# Patient Record
Sex: Male | Born: 1970 | Race: White | Hispanic: No | Marital: Married | Smoking: Never smoker
Health system: Southern US, Community
[De-identification: ages and names within clinical notes are randomized; demographics above are authoritative.]

## PROBLEM LIST (undated history)

## (undated) DIAGNOSIS — F419 Anxiety disorder, unspecified: Secondary | ICD-10-CM

## (undated) DIAGNOSIS — F32A Depression, unspecified: Secondary | ICD-10-CM

## (undated) HISTORY — DX: Anxiety disorder, unspecified: F41.9

## (undated) HISTORY — PX: NO PAST SURGERIES: SHX2092

## (undated) HISTORY — DX: Depression, unspecified: F32.A

---

## 2002-07-25 DIAGNOSIS — F32A Depression, unspecified: Secondary | ICD-10-CM | POA: Insufficient documentation

## 2002-07-25 DIAGNOSIS — G4733 Obstructive sleep apnea (adult) (pediatric): Secondary | ICD-10-CM | POA: Insufficient documentation

## 2005-12-07 ENCOUNTER — Ambulatory Visit: Payer: Self-pay | Admitting: Otolaryngology

## 2007-05-20 DIAGNOSIS — E669 Obesity, unspecified: Secondary | ICD-10-CM | POA: Insufficient documentation

## 2007-06-12 DIAGNOSIS — E881 Lipodystrophy, not elsewhere classified: Secondary | ICD-10-CM | POA: Insufficient documentation

## 2007-11-15 ENCOUNTER — Ambulatory Visit: Payer: Self-pay | Admitting: Family Medicine

## 2013-11-25 ENCOUNTER — Ambulatory Visit: Payer: Self-pay | Admitting: Family Medicine

## 2013-12-06 ENCOUNTER — Ambulatory Visit: Payer: Self-pay | Admitting: Family Medicine

## 2015-11-16 IMAGING — NM NUCLEAR MEDICINE WHOLE BODY BONE SCINTIGRAPHY
2 series · 10 of 10 positions shown · non-contrast
Comparison: Radiographs November 25, 2013.

CLINICAL DATA: L2 sclerotic lesion.  Right elbow pain.

EXAM:
NUCLEAR MEDICINE WHOLE BODY BONE SCAN
TECHNIQUE: Whole body anterior and posterior images were obtained approximately
3 hours after intravenous injection of radiopharmaceutical.
RADIOPHARMACEUTICALS:  23.53 mCi Lechnetium-NN MDP

[Series 1000: 3 hr wholebody · 2.40mm/px · 2 of 2 frames shown]
[frame 1/2]
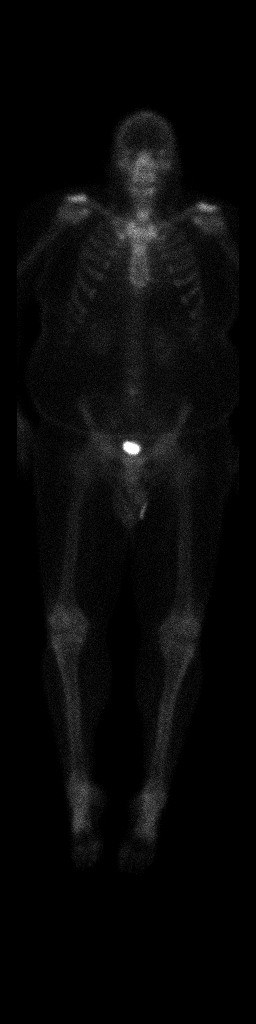
[frame 2/2]
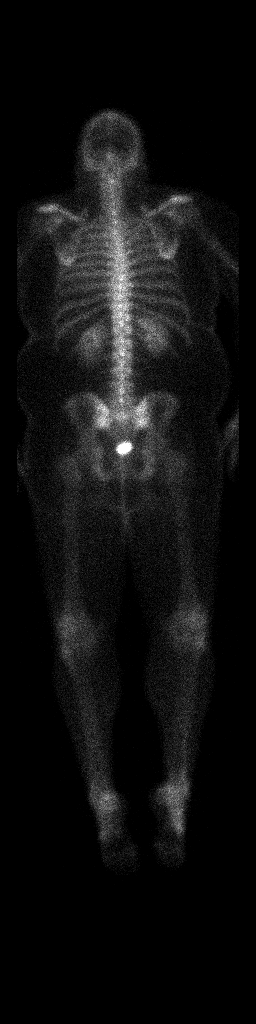

[Series 1000: statics · 2.40mm/px · 4 acquisitions, 8 frames shown]
[im 1/4]
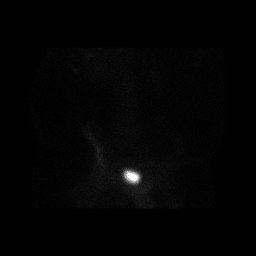
[im 1/4]
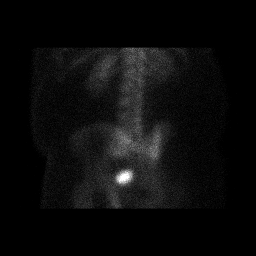
[im 2/4]
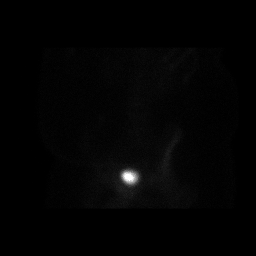
[im 2/4]
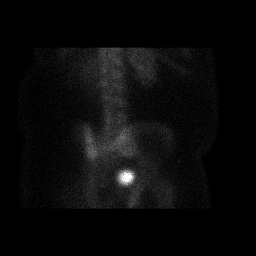
[im 3/4  full-range]
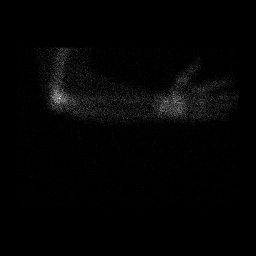
[im 3/4]
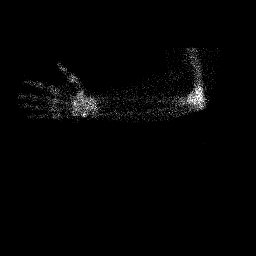
[im 4/4  full-range]
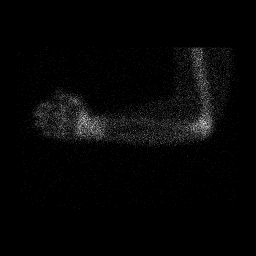
[im 4/4  full-range]
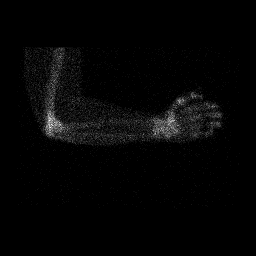

[10 of 10 positions shown; findings below may reference images not displayed]

FINDINGS: No abnormal uptake is seen in the skeleton.
IMPRESSION: No abnormal osseous uptake seen.

## 2022-04-16 DIAGNOSIS — R519 Headache, unspecified: Secondary | ICD-10-CM | POA: Insufficient documentation

## 2022-04-16 NOTE — Patient Instructions (Signed)

## 2022-04-18 ENCOUNTER — Encounter: Payer: Self-pay | Admitting: Nurse Practitioner

## 2022-04-18 ENCOUNTER — Ambulatory Visit (INDEPENDENT_AMBULATORY_CARE_PROVIDER_SITE_OTHER): Payer: 59 | Admitting: Nurse Practitioner

## 2022-04-18 VITALS — BP 123/86 | HR 91 | Temp 98.1°F | Ht 69.5 in | Wt 310.1 lb

## 2022-04-18 DIAGNOSIS — Z7689 Persons encountering health services in other specified circumstances: Secondary | ICD-10-CM

## 2022-04-18 DIAGNOSIS — F321 Major depressive disorder, single episode, moderate: Secondary | ICD-10-CM

## 2022-04-18 DIAGNOSIS — Z136 Encounter for screening for cardiovascular disorders: Secondary | ICD-10-CM | POA: Diagnosis not present

## 2022-04-18 DIAGNOSIS — R69 Illness, unspecified: Secondary | ICD-10-CM | POA: Diagnosis not present

## 2022-04-18 DIAGNOSIS — N400 Enlarged prostate without lower urinary tract symptoms: Secondary | ICD-10-CM | POA: Diagnosis not present

## 2022-04-18 DIAGNOSIS — E669 Obesity, unspecified: Secondary | ICD-10-CM | POA: Insufficient documentation

## 2022-04-18 DIAGNOSIS — Z6841 Body Mass Index (BMI) 40.0 and over, adult: Secondary | ICD-10-CM

## 2022-04-18 DIAGNOSIS — Z13220 Encounter for screening for lipoid disorders: Secondary | ICD-10-CM | POA: Diagnosis not present

## 2022-04-18 DIAGNOSIS — Z833 Family history of diabetes mellitus: Secondary | ICD-10-CM | POA: Diagnosis not present

## 2022-04-18 DIAGNOSIS — G4733 Obstructive sleep apnea (adult) (pediatric): Secondary | ICD-10-CM

## 2022-04-18 MED ORDER — SERTRALINE HCL 100 MG PO TABS: 200.0000 mg | ORAL_TABLET | Freq: Every day | ORAL | 4 refills | Status: DC

## 2022-04-18 NOTE — Assessment & Plan Note (Signed)
BMI 45.14.  Recommended eating smaller high protein, low fat meals more frequently and exercising 30 mins a day 5 times a week with a goal of 10-15lb weight loss in the next 3 months. Patient voiced their understanding and motivation to adhere to these recommendations.  Labs today to include CBC, CMP, TSH, lipid, A1c.

## 2022-04-18 NOTE — Assessment & Plan Note (Signed)
Chronic, ongoing for many years.  Denies SI/HI.  At this time wishes to maintain current medication regimen, but discussed with him adding on Wellbutrin in future for mood -- which has less sex drive and weight gain side effects, but may increase motivation and mood.  Refills sent in on Sertraline 200 MG QHS.

## 2022-04-18 NOTE — Assessment & Plan Note (Signed)
Ongoing with current non-use of CPAP due to issues with mask.  Highly recommend new sleep study and use of CPAP.  Educated on risks of non-use over time to health.  He will check with insurance and alert provider if coverage for new study. Will order referral to [facility] if so.

## 2022-04-18 NOTE — Progress Notes (Signed)
New Patient Office Visit  Subjective    Patient ID: Roger Ray, male    DOB: 08/13/1970  Age: 51 y.o. MRN: [id number]  CC:  Chief Complaint  Patient presents with   New Patient (Initial Visit)    Patient is here for New Patient to Establish Care. Patient denies having any concerns at today's visit.     HPI Roger Ray presents for new patient visit to establish care.  Introduced to Publishing rights manager role and practice setting.  All questions answered.  Discussed provider/patient relationship and expectations.  Has not had a physical or labs in a long while.    SLEEP APNEA Diagnosed several years ago, does not use CPAP as mask is difficult to use and uncomfortable. Sleep apnea status: uncontrolled Duration: chronic Satisfied with current treatment?:  no CPAP use:  no Sleep quality with CPAP use: poor Treament compliance:poor compliance Last sleep study: years ago Treatments attempted: CPAP Wakes feeling refreshed:  no Daytime hypersomnolence:  yes Fatigue:  yes Insomnia:  yes Good sleep hygiene:  yes Difficulty falling asleep:  yes Difficulty staying asleep:  yes Snoring bothers bed partner:  yes Observed apnea by bed partner: yes Obesity:  yes Hypertension: no  Pulmonary hypertension:  no Coronary artery disease:  no   DEPRESSION Has been on Sertraline for many years -- taking 200 MG at night.  He reports this overall helps with mood.  Mood status: stable Satisfied with current treatment?: yes Symptom severity: moderate  Duration of current treatment : chronic Side effects: no Medication compliance: good compliance Psychotherapy/counseling: none Previous psychiatric medications: Sertraline Depressed mood: yes Anxious mood: yes Anhedonia: no Significant weight loss or gain: no Insomnia: yes hard to fall asleep Fatigue: yes Feelings of worthlessness or guilt: no Impaired concentration/indecisiveness: no Suicidal ideations: no Hopelessness: no Crying  spells: no    04/18/2022    2:37 PM  Depression screen PHQ 2/9  Decreased Interest 1  Down, Depressed, Hopeless 2  PHQ - 2 Score 3  Altered sleeping 3  Tired, decreased energy 2  Change in appetite 1  Feeling bad or failure about yourself  2  Trouble concentrating 1  Moving slowly or fidgety/restless 0  Suicidal thoughts 0  PHQ-9 Score 12  Difficult doing work/chores Somewhat difficult       04/18/2022    2:37 PM  GAD 7 : Generalized Anxiety Score  Nervous, Anxious, on Edge 3  Control/stop worrying 2  Worry too much - different things 2  Trouble relaxing 1  Restless 1  Easily annoyed or irritable 2  Afraid - awful might happen 2  Total GAD 7 Score 13  Anxiety Difficulty Somewhat difficult     Outpatient Encounter Medications as of 04/18/2022  Medication Sig   MULTIPLE VITAMIN PO Take 1 tablet by mouth daily.   [DISCONTINUED] sertraline (ZOLOFT) 100 MG tablet Take 100 mg by mouth daily.   sertraline (ZOLOFT) 100 MG tablet Take 2 tablets (200 mg total) by mouth daily.   No facility-administered encounter medications on file as of 04/18/2022.    Past Medical History:  Diagnosis Date   Anxiety    Depression     Past Surgical History:  Procedure Laterality Date   NO PAST SURGERIES      Family History  Problem Relation Age of Onset   Diabetes Mother    Stroke Father    Healthy Brother    Autism spectrum disorder Son    Stroke Paternal Grandfather     Social  History   Socioeconomic History   Marital status: Married    Spouse name: Not on file   Number of children: 1   Years of education: Not on file   Highest education level: Not on file  Occupational History   Not on file  Tobacco Use   Smoking status: Never   Smokeless tobacco: Never  Vaping Use   Vaping Use: Never used  Substance and Sexual Activity   Alcohol use: Never   Drug use: Never   Sexual activity: Not Currently  Other Topics Concern   Not on file  Social History Narrative   Not on  file   Social Determinants of Health   Financial Resource Strain: Low Risk  (04/18/2022)   Overall Financial Resource Strain (CARDIA)    Difficulty of Paying Living Expenses: Not hard at all  Food Insecurity: No Food Insecurity (04/18/2022)   Hunger Vital Sign    Worried About Running Out of Food in the Last Year: Never true    Ran Out of Food in the Last Year: Never true  Transportation Needs: No Transportation Needs (04/18/2022)   PRAPARE - Administrator, Civil Service (Medical): No    Lack of Transportation (Non-Medical): No  Physical Activity: Inactive (04/18/2022)   Exercise Vital Sign    Days of Exercise per Week: 0 days    Minutes of Exercise per Session: 0 min  Stress: No Stress Concern Present (04/18/2022)   [employer] of Occupational Health - Occupational Stress Questionnaire    Feeling of Stress : Not at all  Social Connections: Moderately Integrated (04/18/2022)   Social Connection and Isolation Panel [NHANES]    Frequency of Communication with Friends and Family: More than three times a week    Frequency of Social Gatherings with Friends and Family: More than three times a week    Attends Religious Services: More than 4 times per year    Active Member of [employer] or Organizations: No    Attends Banker Meetings: Never    Marital Status: Married  Catering manager Violence: Not At Risk (04/18/2022)   Humiliation, Afraid, Rape, and Kick questionnaire    Fear of Current or Ex-Partner: No    Emotionally Abused: No    Physically Abused: No    Sexually Abused: No    Review of Systems  Constitutional:  Negative for chills, diaphoresis, fever and weight loss.  Respiratory:  Negative for cough, shortness of breath and wheezing.   Cardiovascular:  Negative for chest pain, palpitations, orthopnea and leg swelling.  Gastrointestinal: Negative.   Neurological: Negative.   Endo/Heme/Allergies: Negative.   Psychiatric/Behavioral:  Positive for  depression. Negative for suicidal ideas. The patient is nervous/anxious and has insomnia.       Objective    BP 123/86   Pulse 91   Temp 98.1 F (36.7 C) (Oral)   Ht 5' 9.5" (1.765 m)   Wt (!) 310 lb 1.6 oz (140.7 kg)   SpO2 94%   BMI 45.14 kg/m   Physical Exam Vitals and nursing note reviewed.  Constitutional:      General: He is awake. He is not in acute distress.    Appearance: He is well-developed and well-groomed. He is obese. He is not ill-appearing or toxic-appearing.  HENT:     Head: Normocephalic and atraumatic.     Right Ear: Hearing normal. No drainage.     Left Ear: Hearing normal. No drainage.  Eyes:     General: Lids  are normal.        Right eye: No discharge.        Left eye: No discharge.     Conjunctiva/sclera: Conjunctivae normal.     Pupils: Pupils are equal, round, and reactive to light.  Neck:     Thyroid: No thyromegaly.     Vascular: No carotid bruit.  Cardiovascular:     Rate and Rhythm: Normal rate and regular rhythm.     Heart sounds: Normal heart sounds, S1 normal and S2 normal. No murmur heard.    No gallop.  Pulmonary:     Effort: Pulmonary effort is normal. No accessory muscle usage or respiratory distress.     Breath sounds: Normal breath sounds.  Abdominal:     General: Bowel sounds are normal.     Palpations: Abdomen is soft. There is no hepatomegaly or splenomegaly.  Musculoskeletal:        General: Normal range of motion.     Cervical back: Normal range of motion and neck supple.     Right lower leg: No edema.     Left lower leg: No edema.  Lymphadenopathy:     Cervical: No cervical adenopathy.  Skin:    General: Skin is warm and dry.     Capillary Refill: Capillary refill takes less than 2 seconds.  Neurological:     Mental Status: He is alert and oriented to person, place, and time.     Deep Tendon Reflexes: Reflexes are normal and symmetric.  Psychiatric:        Attention and Perception: Attention normal.        Mood and  Affect: Mood normal.        Speech: Speech normal.        Behavior: Behavior normal. Behavior is cooperative.        Thought Content: Thought content normal.       Assessment & Plan:   Problem List Items Addressed This Visit       Respiratory   Obstructive apnea    Ongoing with current non-use of CPAP due to issues with mask.  Highly recommend new sleep study and use of CPAP.  Educated on risks of non-use over time to health.  He will check with insurance and alert provider if coverage for new study. Will order referral to [facility] if so.      Relevant Orders   CBC with Differential/Platelet   TSH     Other   Clinical depression - Primary    Chronic, ongoing for many years.  Denies SI/HI.  At this time wishes to maintain current medication regimen, but discussed with him adding on Wellbutrin in future for mood -- which has less sex drive and weight gain side effects, but may increase motivation and mood.  Refills sent in on Sertraline 200 MG QHS.      Relevant Medications   sertraline (ZOLOFT) 100 MG tablet   Other Relevant Orders   TSH   Family history of diabetes mellitus    In mother.  Will check A1c on patient today.      Obesity    BMI 45.14.  Recommended eating smaller high protein, low fat meals more frequently and exercising 30 mins a day 5 times a week with a goal of 10-15lb weight loss in the next 3 months. Patient voiced their understanding and motivation to adhere to these recommendations.  Labs today to include CBC, CMP, TSH, lipid, A1c.       Other  Visit Diagnoses     Benign prostatic hyperplasia without lower urinary tract symptoms       PSA check on labs today, discussed with patient.   Relevant Orders   PSA   Encounter for lipid screening for cardiovascular disease       Lipid panel on labs today.   Relevant Orders   Comprehensive metabolic panel   Lipid Panel w/o Chol/HDL Ratio   Encounter to establish care       New patient to clinic, introduced to  clinic setting and provider.       Return in about 6 months (around 10/17/2022) for DEPRESSION AND OSA.   Marjie Skiff, NP

## 2022-04-18 NOTE — Assessment & Plan Note (Signed)
In mother.  Will check A1c on patient today.

## 2022-04-19 LAB — COMPREHENSIVE METABOLIC PANEL
ALT: 19 IU/L (ref 0–44)
AST: 21 IU/L (ref 0–40)
Albumin/Globulin Ratio: 1.8 (ref 1.2–2.2)
Albumin: 5.1 g/dL (ref 4.1–5.1)
Alkaline Phosphatase: 73 IU/L (ref 44–121)
BUN/Creatinine Ratio: 15 (ref 9–20)
BUN: 12 mg/dL (ref 6–24)
Bilirubin Total: 0.4 mg/dL (ref 0.0–1.2)
CO2: 23 mmol/L (ref 20–29)
Calcium: 9.9 mg/dL (ref 8.7–10.2)
Chloride: 100 mmol/L (ref 96–106)
Creatinine, Ser: 0.81 mg/dL (ref 0.76–1.27)
Globulin, Total: 2.9 g/dL (ref 1.5–4.5)
Glucose: 82 mg/dL (ref 70–99)
Potassium: 4.2 mmol/L (ref 3.5–5.2)
Sodium: 140 mmol/L (ref 134–144)
Total Protein: 8 g/dL (ref 6.0–8.5)
eGFR: 107 mL/min/{1.73_m2} (ref >59)

## 2022-04-19 LAB — CBC WITH DIFFERENTIAL/PLATELET
Basophils Absolute: 0.1 10*3/uL (ref 0.0–0.2)
Basos: 1 %
EOS (ABSOLUTE): 0.2 10*3/uL (ref 0.0–0.4)
Eos: 3 %
Hematocrit: 43.7 % (ref 37.5–51.0)
Hemoglobin: 14.6 g/dL (ref 13.0–17.7)
Immature Grans (Abs): 0 10*3/uL (ref 0.0–0.1)
Immature Granulocytes: 0 %
Lymphocytes Absolute: 1.6 10*3/uL (ref 0.7–3.1)
Lymphs: 24 %
MCH: 29.9 pg (ref 26.6–33.0)
MCHC: 33.4 g/dL (ref 31.5–35.7)
MCV: 90 fL (ref 79–97)
Monocytes Absolute: 0.6 10*3/uL (ref 0.1–0.9)
Monocytes: 9 %
Neutrophils Absolute: 4.4 10*3/uL (ref 1.4–7.0)
Neutrophils: 63 %
Platelets: 261 10*3/uL (ref 150–450)
RBC: 4.88 x10E6/uL (ref 4.14–5.80)
RDW: 12.7 % (ref 11.6–15.4)
WBC: 6.9 10*3/uL (ref 3.4–10.8)

## 2022-04-19 LAB — LIPID PANEL W/O CHOL/HDL RATIO
Cholesterol, Total: 238 mg/dL — ABNORMAL HIGH (ref 100–199)
HDL: 51 mg/dL (ref >39)
LDL Chol Calc (NIH): 145 mg/dL — ABNORMAL HIGH (ref 0–99)
Triglycerides: 230 mg/dL — ABNORMAL HIGH (ref 0–149)
VLDL Cholesterol Cal: 42 mg/dL — ABNORMAL HIGH (ref 5–40)

## 2022-04-19 LAB — TSH: TSH: 1.73 u[IU]/mL (ref 0.450–4.500)

## 2022-04-19 LAB — PSA: Prostate Specific Ag, Serum: 1 ng/mL (ref 0.0–4.0)

## 2022-04-19 NOTE — Progress Notes (Signed)
Contacted via MyChart    Good morning Roger Ray, your labs have returned and are overall stable with exception of cholesterol levels.  Your cholesterol is high, but recommendations to make lifestyle changes. Your LDL is above normal. The LDL is the bad cholesterol. Over time and in combination with inflammation and other factors, this contributes to plaque which in turn may lead to stroke and/or heart attack down the road. Sometimes high LDL is primarily genetic, and people might be eating all the right foods but still have high numbers. Other times, there is room for improvement in one's diet and eating healthier can bring this number down and potentially reduce one's risk of heart attack and/or stroke.   To reduce your LDL, Remember - more fruits and vegetables, more fish, and limit red meat and dairy products. More soy, nuts, beans, barley, lentils, oats and plant sterol ester enriched margarine instead of butter. I also encourage eliminating sugar and processed food. Remember, shop on the outside of the grocery store and visit your [employer]. If you would like to talk with me about dietary changes for your cholesterol, please let me know. We should recheck your cholesterol in 6 months. Any questions on these? Keep being stellar!!  Thank you for allowing me to participate in your care.  I appreciate you. Kindest regards, Marjean Imperato

## 2022-08-16 ENCOUNTER — Ambulatory Visit: Payer: 59 | Admitting: Nurse Practitioner

## 2022-10-15 DIAGNOSIS — E7800 Pure hypercholesterolemia, unspecified: Secondary | ICD-10-CM | POA: Insufficient documentation

## 2022-10-15 NOTE — Patient Instructions (Incomplete)
Be Involved in [place]:  Taking Medications When medications are taken as directed, they can greatly improve your health. But if they are not taken as instructed, they may not work. In some cases, not taking them correctly can be harmful. To help ensure your treatment remains effective and safe, understand your medications and how to take them.  Your lab results, notes and after visit summary will be available on My Chart. We strongly encourage you to use this feature. If lab results are abnormal the clinic will contact you with the appropriate steps. If the clinic does not contact you assume the results are satisfactory. You can always see your results on My Chart. If you have questions regarding your condition, please contact the clinic during office hours. You can also ask questions on My Chart.  We at [facility] are grateful that you chose [place] to provide care. We strive to provide excellent and compassionate care and are always looking for feedback. If you get a survey from the clinic please complete this.   Check blood pressure 3 days a week and write them down.     DASH Eating Plan DASH stands for Dietary Approaches to Stop Hypertension. The DASH eating plan is a healthy eating plan that has been shown to: Reduce high blood pressure (hypertension). Reduce your risk for type 2 diabetes, heart disease, and stroke. Help with weight loss. What are tips for following this plan? Reading food labels Check food labels for the amount of salt (sodium) per serving. Choose foods with less than 5 percent of the Daily Value of sodium. Generally, foods with less than 300 milligrams (mg) of sodium per serving fit into this eating plan. To find whole grains, look for the word "whole" as the first word in the ingredient list. Shopping Buy products labeled as "low-sodium" or "no salt added." Buy fresh foods. Avoid canned foods and pre-made or frozen meals. Cooking Avoid adding salt  when cooking. Use salt-free seasonings or herbs instead of table salt or sea salt. Check with your health care provider or pharmacist before using salt substitutes. Do not fry foods. Cook foods using healthy methods such as baking, boiling, grilling, roasting, and broiling instead. Cook with heart-healthy oils, such as olive, canola, avocado, soybean, or sunflower oil. Meal planning  Eat a balanced diet that includes: 4 or more servings of fruits and 4 or more servings of vegetables each day. Try to fill one-half of your plate with fruits and vegetables. 6-8 servings of whole grains each day. Less than 6 oz (170 g) of lean meat, poultry, or fish each day. A 3-oz (85-g) serving of meat is about the same size as a deck of cards. One egg equals 1 oz (28 g). 2-3 servings of low-fat dairy each day. One serving is 1 cup (237 mL). 1 serving of nuts, seeds, or beans 5 times each week. 2-3 servings of heart-healthy fats. Healthy fats called omega-3 fatty acids are found in foods such as walnuts, flaxseeds, fortified milks, and eggs. These fats are also found in cold-water fish, such as sardines, salmon, and mackerel. Limit how much you eat of: Canned or prepackaged foods. Food that is high in trans fat, such as some fried foods. Food that is high in saturated fat, such as fatty meat. Desserts and other sweets, sugary drinks, and other foods with added sugar. Full-fat dairy products. Do not salt foods before eating. Do not eat more than 4 egg yolks a week. Try to  eat at least 2 vegetarian meals a week. Eat more home-cooked food and less restaurant, buffet, and fast food. Lifestyle When eating at a restaurant, ask that your food be prepared with less salt or no salt, if possible. If you drink alcohol: Limit how much you use to: 0-1 drink a day for women who are not pregnant. 0-2 drinks a day for men. Be aware of how much alcohol is in your drink. In the [place] one drink equals one 12 oz bottle of  beer (355 mL), one 5 oz glass of wine (148 mL), or one 1 oz glass of hard liquor (44 mL). General information Avoid eating more than 2,300 mg of salt a day. If you have hypertension, you may need to reduce your sodium intake to 1,500 mg a day. Work with your health care provider to maintain a healthy body weight or to lose weight. Ask what an ideal weight is for you. Get at least 30 minutes of exercise that causes your heart to beat faster (aerobic exercise) most days of the week. Activities may include walking, swimming, or biking. Work with your health care provider or dietitian to adjust your eating plan to your individual calorie needs. What foods should I eat? Fruits All fresh, dried, or frozen fruit. Canned fruit in natural juice (without added sugar). Vegetables Fresh or frozen vegetables (raw, steamed, roasted, or grilled). Low-sodium or reduced-sodium tomato and vegetable juice. Low-sodium or reduced-sodium tomato sauce and tomato paste. Low-sodium or reduced-sodium canned vegetables. Grains Whole-grain or whole-wheat bread. Whole-grain or whole-wheat pasta. Brown rice. [place]. Bulgur. Whole-grain and low-sodium cereals. Pita bread. Low-fat, low-sodium crackers. Whole-wheat flour tortillas. Meats and other proteins Skinless chicken or [place]. Ground chicken or [place]. Pork with fat trimmed off. Fish and seafood. Egg whites. Dried beans, peas, or lentils. Unsalted nuts, nut butters, and seeds. Unsalted canned beans. Lean cuts of beef with fat trimmed off. Low-sodium, lean precooked or cured meat, such as sausages or meat loaves. Dairy Low-fat (1%) or fat-free (skim) milk. Reduced-fat, low-fat, or fat-free cheeses. Nonfat, low-sodium ricotta or cottage cheese. Low-fat or nonfat yogurt. Low-fat, low-sodium cheese. Fats and oils Soft margarine without trans fats. Vegetable oil. Reduced-fat, low-fat, or light mayonnaise and salad dressings (reduced-sodium). Canola, safflower, olive,  avocado, soybean, and sunflower oils. Avocado. Seasonings and condiments Herbs. Spices. Seasoning mixes without salt. Other foods Unsalted popcorn and pretzels. Fat-free sweets. The items listed above may not be a complete list of foods and beverages you can eat. Contact a dietitian for more information. What foods should I avoid? Fruits Canned fruit in a light or heavy syrup. Fried fruit. Fruit in cream or butter sauce. Vegetables Creamed or fried vegetables. Vegetables in a cheese sauce. Regular canned vegetables (not low-sodium or reduced-sodium). Regular canned tomato sauce and paste (not low-sodium or reduced-sodium). Regular tomato and vegetable juice (not low-sodium or reduced-sodium). Angie Fava. Olives. Grains Baked goods made with fat, such as croissants, muffins, or some breads. Dry pasta or rice meal packs. Meats and other proteins Fatty cuts of meat. Ribs. Fried meat. Berniece Salines. Bologna, salami, and other precooked or cured meats, such as sausages or meat loaves. Fat from the back of a pig (fatback). Bratwurst. Salted nuts and seeds. Canned beans with added salt. Canned or smoked fish. Whole eggs or egg yolks. Chicken or [place] with skin. Dairy Whole or 2% milk, cream, and half-and-half. Whole or full-fat cream cheese. Whole-fat or sweetened yogurt. Full-fat cheese. Nondairy creamers. Whipped toppings. Processed cheese and cheese spreads. Fats and  oils Butter. Stick margarine. Lard. Shortening. Ghee. Bacon fat. Tropical oils, such as coconut, palm kernel, or palm oil. Seasonings and condiments Onion salt, garlic salt, seasoned salt, table salt, and sea salt. Worcestershire sauce. Tartar sauce. Barbecue sauce. Teriyaki sauce. Soy sauce, including reduced-sodium. Steak sauce. Canned and packaged gravies. Fish sauce. Oyster sauce. Cocktail sauce. Store-bought horseradish. Ketchup. Mustard. Meat flavorings and tenderizers. Bouillon cubes. Hot sauces. Pre-made or packaged marinades. Pre-made or  packaged taco seasonings. Relishes. Regular salad dressings. Other foods Salted popcorn and pretzels. The items listed above may not be a complete list of foods and beverages you should avoid. Contact a dietitian for more information. Where to find more information National Heart, Lung, and Blood Institute: https://wilson-eaton.com/ American Heart Association: www.heart.org Academy of Nutrition and Dietetics: www.eatright.[place]: www.kidney.org Summary The DASH eating plan is a healthy eating plan that has been shown to reduce high blood pressure (hypertension). It may also reduce your risk for type 2 diabetes, heart disease, and stroke. When on the DASH eating plan, aim to eat more fresh fruits and vegetables, whole grains, lean proteins, low-fat dairy, and heart-healthy fats. With the DASH eating plan, you should limit salt (sodium) intake to 2,300 mg a day. If you have hypertension, you may need to reduce your sodium intake to 1,500 mg a day. Work with your health care provider or dietitian to adjust your eating plan to your individual calorie needs. This information is not intended to replace advice given to you by your health care provider. Make sure you discuss any questions you have with your health care provider. Document Revised: 06/14/2019 Document Reviewed: 06/14/2019 Elsevier Patient Education  [place].

## 2022-10-17 ENCOUNTER — Encounter: Payer: Self-pay | Admitting: Nurse Practitioner

## 2022-10-17 ENCOUNTER — Ambulatory Visit (INDEPENDENT_AMBULATORY_CARE_PROVIDER_SITE_OTHER): Payer: 59 | Admitting: Nurse Practitioner

## 2022-10-17 VITALS — BP 142/90 | HR 99 | Temp 97.9°F | Resp 18 | Ht 69.49 in | Wt 309.5 lb

## 2022-10-17 DIAGNOSIS — Z6841 Body Mass Index (BMI) 40.0 and over, adult: Secondary | ICD-10-CM

## 2022-10-17 DIAGNOSIS — G4733 Obstructive sleep apnea (adult) (pediatric): Secondary | ICD-10-CM | POA: Diagnosis not present

## 2022-10-17 DIAGNOSIS — R030 Elevated blood-pressure reading, without diagnosis of hypertension: Secondary | ICD-10-CM | POA: Diagnosis not present

## 2022-10-17 DIAGNOSIS — E7800 Pure hypercholesterolemia, unspecified: Secondary | ICD-10-CM

## 2022-10-17 DIAGNOSIS — I10 Essential (primary) hypertension: Secondary | ICD-10-CM | POA: Insufficient documentation

## 2022-10-17 DIAGNOSIS — F3341 Major depressive disorder, recurrent, in partial remission: Secondary | ICD-10-CM | POA: Diagnosis not present

## 2022-10-17 NOTE — Assessment & Plan Note (Signed)
Noted on past labs, has not ate today.  Check lipid panel and continue diet focus.

## 2022-10-17 NOTE — Assessment & Plan Note (Addendum)
BMI 45.07.  Recommended eating smaller high protein, low fat meals more frequently and exercising 30 mins a day 5 times a week with a goal of 10-15lb weight loss in the next 3 months. Patient voiced their understanding and motivation to adhere to these recommendations.

## 2022-10-17 NOTE — Assessment & Plan Note (Signed)
Ongoing with current non-use of CPAP due to issues with mask.  Highly recommend new sleep study and use of CPAP.  Educated on risks of non-use over time to health, which we are seeing today with elevation in BP readings.  He will check with insurance and alert provider if coverage for new study. Will order referral to [facility] [facility] if so.

## 2022-10-17 NOTE — Assessment & Plan Note (Addendum)
Chronic, ongoing for many years.  Denies SI/HI.  At this time wishes to maintain current medication regimen, but discussed with him adding on Wellbutrin in future for mood -- which has less sex drive and weight gain side effects, but may increase motivation and mood.  Refills up to date.

## 2022-10-17 NOTE — Progress Notes (Addendum)
BP (!) 142/90 (BP Location: Left Arm, Patient Position: Sitting, Cuff Size: Large)   Pulse 99   Temp 97.9 F (36.6 C) (Oral)   Resp 18   Ht 5' 9.49" (1.765 m)   Wt (!) 309 lb 8 oz (140.4 kg)   SpO2 95%   BMI 45.07 kg/m    Subjective:    Patient ID: Roger Ray, male    DOB: [birth year], 52 y.o.   MRN: [id number]  HPI: Roger Ray is a 52 y.o. male  Chief Complaint  Patient presents with   Depression   Sleep Apnea   BP ELEVATION & OSA Elevated BP in office today. No alcohol use or smoking.  Does not use CPAP, not comfortable.  Used in past.  Has not used in awhile.  Last sleep study was years ago -- several.   Recurrent headaches: no Visual changes: no Palpitations: no Dyspnea: no Chest pain: no Lower extremity edema: no Dizzy/lightheaded: no   DEPRESSION Continues on Sertraline 200 MG daily. Mood status: stable Satisfied with current treatment?: yes Symptom severity: moderate  Duration of current treatment : chronic Side effects: no Medication compliance: good compliance Psychotherapy/counseling: yes in the past Depressed mood: no Anxious mood: no Anhedonia: no Significant weight loss or gain: no Insomnia: yes hard to stay asleep Fatigue: yes Feelings of worthlessness or guilt: no Impaired concentration/indecisiveness: no Suicidal ideations: no Hopelessness: no Crying spells: no    10/17/2022    3:38 PM 04/18/2022    2:37 PM  Depression screen PHQ 2/9  Decreased Interest 0 1  Down, Depressed, Hopeless 0 2  PHQ - 2 Score 0 3  Altered sleeping 2 3  Tired, decreased energy 1 2  Change in appetite 0 1  Feeling bad or failure about yourself  1 2  Trouble concentrating 0 1  Moving slowly or fidgety/restless 0 0  Suicidal thoughts  0  PHQ-9 Score 4 12  Difficult doing work/chores Somewhat difficult Somewhat difficult       10/17/2022    3:38 PM 04/18/2022    2:37 PM  GAD 7 : Generalized Anxiety Score  Nervous, Anxious, on Edge 0 3  Control/stop  worrying 1 2  Worry too much - different things 1 2  Trouble relaxing 1 1  Restless 0 1  Easily annoyed or irritable 1 2  Afraid - awful might happen 0 2  Total GAD 7 Score 4 13  Anxiety Difficulty Somewhat difficult Somewhat difficult   Relevant past medical, surgical, family and social history reviewed and updated as indicated. Interim medical history since our last visit reviewed. Allergies and medications reviewed and updated.  Review of Systems  Constitutional:  Negative for activity change, diaphoresis, fatigue and fever.  Respiratory:  Negative for cough, chest tightness, shortness of breath and wheezing.   Cardiovascular:  Negative for chest pain, palpitations and leg swelling.  Gastrointestinal: Negative.   Neurological: Negative.   Psychiatric/Behavioral: Negative.      Per HPI unless specifically indicated above     Objective:    BP (!) 142/90 (BP Location: Left Arm, Patient Position: Sitting, Cuff Size: Large)   Pulse 99   Temp 97.9 F (36.6 C) (Oral)   Resp 18   Ht 5' 9.49" (1.765 m)   Wt (!) 309 lb 8 oz (140.4 kg)   SpO2 95%   BMI 45.07 kg/m   Wt Readings from Last 3 Encounters:  10/17/22 (!) 309 lb 8 oz (140.4 kg)  04/18/22 (!) 310 lb 1.6  oz (140.7 kg)    Physical Exam Vitals and nursing note reviewed.  Constitutional:      General: He is awake. He is not in acute distress.    Appearance: He is well-developed and well-groomed. He is obese. He is not ill-appearing or toxic-appearing.  HENT:     Head: Normocephalic and atraumatic.     Right Ear: Hearing normal. No drainage.     Left Ear: Hearing normal. No drainage.  Eyes:     General: Lids are normal.        Right eye: No discharge.        Left eye: No discharge.     Conjunctiva/sclera: Conjunctivae normal.     Pupils: Pupils are equal, round, and reactive to light.  Neck:     Thyroid: No thyromegaly.     Vascular: No carotid bruit.  Cardiovascular:     Rate and Rhythm: Normal rate and regular  rhythm.     Heart sounds: Normal heart sounds, S1 normal and S2 normal. No murmur heard.    No gallop.  Pulmonary:     Effort: Pulmonary effort is normal. No accessory muscle usage or respiratory distress.     Breath sounds: Normal breath sounds.  Abdominal:     General: Bowel sounds are normal.     Palpations: Abdomen is soft. There is no hepatomegaly or splenomegaly.  Musculoskeletal:        General: Normal range of motion.     Cervical back: Normal range of motion and neck supple.     Right lower leg: No edema.     Left lower leg: No edema.  Lymphadenopathy:     Cervical: No cervical adenopathy.  Skin:    General: Skin is warm and dry.     Capillary Refill: Capillary refill takes less than 2 seconds.  Neurological:     Mental Status: He is alert and oriented to person, place, and time.     Deep Tendon Reflexes: Reflexes are normal and symmetric.  Psychiatric:        Attention and Perception: Attention normal.        Mood and Affect: Mood normal.        Speech: Speech normal.        Behavior: Behavior normal. Behavior is cooperative.        Thought Content: Thought content normal.    Results for orders placed or performed in visit on 04/18/22  CBC with Differential/Platelet  Result Value Ref Range   WBC 6.9 3.4 - 10.8 x10E3/uL   RBC 4.88 4.14 - 5.80 x10E6/uL   Hemoglobin 14.6 13.0 - 17.7 g/dL   Hematocrit 43.7 37.5 - 51.0 %   MCV 90 79 - 97 fL   MCH 29.9 26.6 - 33.0 pg   MCHC 33.4 31.5 - 35.7 g/dL   RDW 12.7 11.6 - 15.4 %   Platelets 261 150 - 450 x10E3/uL   Neutrophils 63 Not Estab. %   Lymphs 24 Not Estab. %   Monocytes 9 Not Estab. %   Eos 3 Not Estab. %   Basos 1 Not Estab. %   Neutrophils Absolute 4.4 1.4 - 7.0 x10E3/uL   Lymphocytes Absolute 1.6 0.7 - 3.1 x10E3/uL   Monocytes Absolute 0.6 0.1 - 0.9 x10E3/uL   EOS (ABSOLUTE) 0.2 0.0 - 0.4 x10E3/uL   Basophils Absolute 0.1 0.0 - 0.2 x10E3/uL   Immature Granulocytes 0 Not Estab. %   Immature Grans (Abs) 0.0  0.0 - 0.1 x10E3/uL  Comprehensive metabolic panel  Result Value Ref Range   Glucose 82 70 - 99 mg/dL   BUN 12 6 - 24 mg/dL   Creatinine, Ser 0.81 0.76 - 1.27 mg/dL   eGFR 107 >59 mL/min/1.73   BUN/Creatinine Ratio 15 9 - 20   Sodium 140 134 - 144 mmol/L   Potassium 4.2 3.5 - 5.2 mmol/L   Chloride 100 96 - 106 mmol/L   CO2 23 20 - 29 mmol/L   Calcium 9.9 8.7 - 10.2 mg/dL   Total Protein 8.0 6.0 - 8.5 g/dL   Albumin 5.1 4.1 - 5.1 g/dL   Globulin, Total 2.9 1.5 - 4.5 g/dL   Albumin/Globulin Ratio 1.8 1.2 - 2.2   Bilirubin Total 0.4 0.0 - 1.2 mg/dL   Alkaline Phosphatase 73 44 - 121 IU/L   AST 21 0 - 40 IU/L   ALT 19 0 - 44 IU/L  Lipid Panel w/o Chol/HDL Ratio  Result Value Ref Range   Cholesterol, Total 238 (H) 100 - 199 mg/dL   Triglycerides 230 (H) 0 - 149 mg/dL   HDL 51 >39 mg/dL   VLDL Cholesterol Cal 42 (H) 5 - 40 mg/dL   LDL Chol Calc (NIH) 145 (H) 0 - 99 mg/dL  PSA  Result Value Ref Range   Prostate Specific Ag, Serum 1.0 0.0 - 4.0 ng/mL  TSH  Result Value Ref Range   TSH 1.730 0.450 - 4.500 uIU/mL      Assessment & Plan:   Problem List Items Addressed This Visit       Respiratory   Obstructive apnea    Ongoing with current non-use of CPAP due to issues with mask.  Highly recommend new sleep study and use of CPAP.  Educated on risks of non-use over time to health, which we are seeing today with elevation in BP readings.  He will check with insurance and alert provider if coverage for new study. Will order referral to [facility] if so.        Other   Clinical depression - Primary    Chronic, ongoing for many years.  Denies SI/HI.  At this time wishes to maintain current medication regimen, but discussed with him adding on Wellbutrin in future for mood -- which has less sex drive and weight gain side effects, but may increase motivation and mood.  Refills up to date.      Elevated blood pressure reading without diagnosis of hypertension    Noted today in office on  initial and recheck. Discussed at length with him need to for lower BP and discussed that he would benefit from new sleep study, as consistent use of CPAP would benefit BP levels.  He will speak to spouse about this and see coverage.  At this time recommend check BP 3 days a week and document levels for provider.  Focus heavily on DASH diet and regular activity.  Return in 6 weeks with BP log.  If ongoing elevations he is aware we will need to initiate medication.  Labs today.      Relevant Orders   Comprehensive metabolic panel   Elevated low density lipoprotein (LDL) cholesterol level    Noted on past labs, has not ate today.  Check lipid panel and continue diet focus.      Relevant Orders   Lipid Panel w/o Chol/HDL Ratio   Obesity    BMI 45.07.  Recommended eating smaller high protein, low fat meals more frequently and exercising 30 mins a day  5 times a week with a goal of 10-15lb weight loss in the next 3 months. Patient voiced their understanding and motivation to adhere to these recommendations.          Follow up plan: Return in about 6 weeks (around 11/28/2022) for ELEVATED BP.

## 2022-10-17 NOTE — Assessment & Plan Note (Signed)
Noted today in office on initial and recheck. Discussed at length with him need to for lower BP and discussed that he would benefit from new sleep study, as consistent use of CPAP would benefit BP levels.  He will speak to spouse about this and see coverage.  At this time recommend check BP 3 days a week and document levels for provider.  Focus heavily on DASH diet and regular activity.  Return in 6 weeks with BP log.  If ongoing elevations he is aware we will need to initiate medication.  Labs today.

## 2022-10-18 LAB — COMPREHENSIVE METABOLIC PANEL
ALT: 20 IU/L (ref 0–44)
AST: 21 IU/L (ref 0–40)
Albumin/Globulin Ratio: 1.6 (ref 1.2–2.2)
Albumin: 4.7 g/dL (ref 3.8–4.9)
Alkaline Phosphatase: 79 IU/L (ref 44–121)
BUN/Creatinine Ratio: 15 (ref 9–20)
BUN: 14 mg/dL (ref 6–24)
Bilirubin Total: 0.4 mg/dL (ref 0.0–1.2)
CO2: 22 mmol/L (ref 20–29)
Calcium: 9.7 mg/dL (ref 8.7–10.2)
Chloride: 102 mmol/L (ref 96–106)
Creatinine, Ser: 0.96 mg/dL (ref 0.76–1.27)
Globulin, Total: 3 g/dL (ref 1.5–4.5)
Glucose: 87 mg/dL (ref 70–99)
Potassium: 4.2 mmol/L (ref 3.5–5.2)
Sodium: 141 mmol/L (ref 134–144)
Total Protein: 7.7 g/dL (ref 6.0–8.5)
eGFR: 96 mL/min/{1.73_m2} (ref >59)

## 2022-10-18 LAB — LIPID PANEL W/O CHOL/HDL RATIO
Cholesterol, Total: 252 mg/dL — ABNORMAL HIGH (ref 100–199)
HDL: 54 mg/dL (ref >39)
LDL Chol Calc (NIH): 162 mg/dL — ABNORMAL HIGH (ref 0–99)
Triglycerides: 198 mg/dL — ABNORMAL HIGH (ref 0–149)
VLDL Cholesterol Cal: 36 mg/dL (ref 5–40)

## 2022-10-18 NOTE — Progress Notes (Signed)
Contacted via MyChart The 10-year ASCVD risk score (Arnett DK, et al., 2019) is: 5.8%   Values used to calculate the score:     Age: 52 years     Sex: Male     Is Non-Hispanic African American: No     Diabetic: No     Tobacco smoker: No     Systolic Blood Pressure: [id number] mmHg     Is BP treated: No     HDL Cholesterol: 54 mg/dL     Total Cholesterol: 252 mg/dL   Good morning Clent, your labs have returned.  Cholesterol levels remain elevated.  Your cholesterol is still high, but continued recommendations to make lifestyle changes. Your LDL is above normal. The LDL is the bad cholesterol. Over time and in combination with inflammation and other factors, this contributes to plaque which in turn may lead to stroke and/or heart attack down the road. Sometimes high LDL is primarily genetic, and people might be eating all the right foods but still have high numbers. Other times, there is room for improvement in one's diet and eating healthier can bring this number down and potentially reduce one's risk of heart attack and/or stroke.   To reduce your LDL, Remember - more fruits and vegetables, more fish, and limit red meat and dairy products. More soy, nuts, beans, barley, lentils, oats and plant sterol ester enriched margarine instead of butter. I also encourage eliminating sugar and processed food. Remember, shop on the outside of the grocery store and visit your [employer]. If you would like to talk with me about dietary changes plus or minus medications for your cholesterol, please let me know. We should recheck your cholesterol in 3 months fasting. Remainder of labs stable.  Any questions? Keep being amazing!!  Thank you for allowing me to participate in your care.  I appreciate you. Kindest regards, Alonnie Bieker

## 2022-11-25 ENCOUNTER — Encounter: Payer: Self-pay | Admitting: Nurse Practitioner

## 2022-11-25 ENCOUNTER — Ambulatory Visit (INDEPENDENT_AMBULATORY_CARE_PROVIDER_SITE_OTHER): Payer: 59 | Admitting: Nurse Practitioner

## 2022-11-25 VITALS — BP 146/101 | HR 77 | Temp 97.7°F | Ht 69.49 in | Wt 306.6 lb

## 2022-11-25 DIAGNOSIS — I10 Essential (primary) hypertension: Secondary | ICD-10-CM

## 2022-11-25 MED ORDER — VALSARTAN 40 MG PO TABS: 40.0000 mg | ORAL_TABLET | Freq: Every day | ORAL | 3 refills | Status: DC

## 2022-11-25 NOTE — Assessment & Plan Note (Signed)
Ongoing with elevations continuing.  Will start Valsartan 40 MG daily, educated him on this and side effects + use of medication.  Discussed at length with him need for lower BP and discussed that he would benefit from new sleep study, as consistent use of CPAP would benefit BP levels.  He will speak to spouse about this and see coverage.  At this time recommend check BP 3 days a week and document levels for provider.  Focus heavily on DASH diet and regular activity.  Return in 4 weeks with BP log and will adjust medication as needed.

## 2022-11-25 NOTE — Progress Notes (Signed)
BP (!) 146/101   Pulse 77   Temp 97.7 F (36.5 C) (Oral)   Ht 5' 9.49" (1.765 m)   Wt (!) 306 lb 9.6 oz (139.1 kg)   SpO2 96%   BMI 44.64 kg/m    Subjective:    Patient ID: Roger Ray, male    DOB: 05-Feb-1971, 52 y.o.   MRN: [id number]  HPI: Roger Ray is a 52 y.o. male  Chief Complaint  Patient presents with   Hypertension    Here for follow up   HYPERTENSION  No current medications.  No alcohol use or smoking. Does not use CPAP, not comfortable. Used in past. Has not used in awhile. Last sleep study was years ago -- several.  Mom has HTN.   Hypertension status: uncontrolled  BP monitoring frequency:  a few times a week BP range: 150-160/90 at home Previous BP meds: none Aspirin: no Recurrent headaches: no Visual changes: no Palpitations: no Dyspnea: no Chest pain: no Lower extremity edema: no Dizzy/lightheaded: no   Relevant past medical, surgical, family and social history reviewed and updated as indicated. Interim medical history since our last visit reviewed. Allergies and medications reviewed and updated.  Review of Systems  Constitutional:  Negative for activity change, diaphoresis, fatigue and fever.  Respiratory:  Negative for cough, chest tightness, shortness of breath and wheezing.   Cardiovascular:  Negative for chest pain, palpitations and leg swelling.  Gastrointestinal: Negative.   Neurological: Negative.   Psychiatric/Behavioral: Negative.     Per HPI unless specifically indicated above     Objective:    BP (!) 146/101   Pulse 77   Temp 97.7 F (36.5 C) (Oral)   Ht 5' 9.49" (1.765 m)   Wt (!) 306 lb 9.6 oz (139.1 kg)   SpO2 96%   BMI 44.64 kg/m   Wt Readings from Last 3 Encounters:  11/25/22 (!) 306 lb 9.6 oz (139.1 kg)  10/17/22 (!) 309 lb 8 oz (140.4 kg)  04/18/22 (!) 310 lb 1.6 oz (140.7 kg)    Physical Exam Vitals and nursing note reviewed.  Constitutional:      General: He is awake. He is not in acute distress.     Appearance: He is well-developed and well-groomed. He is obese. He is not ill-appearing or toxic-appearing.  HENT:     Head: Normocephalic and atraumatic.     Right Ear: Hearing normal. No drainage.     Left Ear: Hearing normal. No drainage.  Eyes:     General: Lids are normal.        Right eye: No discharge.        Left eye: No discharge.     Conjunctiva/sclera: Conjunctivae normal.     Pupils: Pupils are equal, round, and reactive to light.  Neck:     Thyroid: No thyromegaly.     Vascular: No carotid bruit.  Cardiovascular:     Rate and Rhythm: Normal rate and regular rhythm.     Heart sounds: Normal heart sounds, S1 normal and S2 normal. No murmur heard.    No gallop.  Pulmonary:     Effort: Pulmonary effort is normal. No accessory muscle usage or respiratory distress.     Breath sounds: Normal breath sounds.  Abdominal:     General: Bowel sounds are normal.     Palpations: Abdomen is soft. There is no hepatomegaly or splenomegaly.  Musculoskeletal:        General: Normal range of motion.  Cervical back: Normal range of motion and neck supple.     Right lower leg: No edema.     Left lower leg: No edema.  Lymphadenopathy:     Cervical: No cervical adenopathy.  Skin:    General: Skin is warm and dry.     Capillary Refill: Capillary refill takes less than 2 seconds.  Neurological:     Mental Status: He is alert and oriented to person, place, and time.     Deep Tendon Reflexes: Reflexes are normal and symmetric.  Psychiatric:        Attention and Perception: Attention normal.        Mood and Affect: Mood normal.        Speech: Speech normal.        Behavior: Behavior normal. Behavior is cooperative.        Thought Content: Thought content normal.    Results for orders placed or performed in visit on 10/17/22  Comprehensive metabolic panel  Result Value Ref Range   Glucose 87 70 - 99 mg/dL   BUN 14 6 - 24 mg/dL   Creatinine, Ser 1.61 0.76 - 1.27 mg/dL   eGFR 96 >09  UE/AVW/0.98   BUN/Creatinine Ratio 15 9 - 20   Sodium 141 134 - 144 mmol/L   Potassium 4.2 3.5 - 5.2 mmol/L   Chloride 102 96 - 106 mmol/L   CO2 22 20 - 29 mmol/L   Calcium 9.7 8.7 - 10.2 mg/dL   Total Protein 7.7 6.0 - 8.5 g/dL   Albumin 4.7 3.8 - 4.9 g/dL   Globulin, Total 3.0 1.5 - 4.5 g/dL   Albumin/Globulin Ratio 1.6 1.2 - 2.2   Bilirubin Total 0.4 0.0 - 1.2 mg/dL   Alkaline Phosphatase 79 44 - 121 IU/L   AST 21 0 - 40 IU/L   ALT 20 0 - 44 IU/L  Lipid Panel w/o Chol/HDL Ratio  Result Value Ref Range   Cholesterol, Total 252 (H) 100 - 199 mg/dL   Triglycerides 119 (H) 0 - 149 mg/dL   HDL 54 >14 mg/dL   VLDL Cholesterol Cal 36 5 - 40 mg/dL   LDL Chol Calc (NIH) 782 (H) 0 - 99 mg/dL      Assessment & Plan:   Problem List Items Addressed This Visit       Cardiovascular and Mediastinum   Essential hypertension - Primary    Ongoing with elevations continuing.  Will start Valsartan 40 MG daily, educated him on this and side effects + use of medication.  Discussed at length with him need for lower BP and discussed that he would benefit from new sleep study, as consistent use of CPAP would benefit BP levels.  He will speak to spouse about this and see coverage.  At this time recommend check BP 3 days a week and document levels for provider.  Focus heavily on DASH diet and regular activity.  Return in 4 weeks with BP log and will adjust medication as needed.      Relevant Medications   valsartan (DIOVAN) 40 MG tablet     Follow up plan: Return in about 4 weeks (around 12/23/2022) for HTN -- started Valsartan 40 MG.

## 2022-11-25 NOTE — Patient Instructions (Signed)

## 2023-01-01 NOTE — Patient Instructions (Signed)

## 2023-01-06 ENCOUNTER — Encounter: Payer: Self-pay | Admitting: Nurse Practitioner

## 2023-01-06 ENCOUNTER — Ambulatory Visit (INDEPENDENT_AMBULATORY_CARE_PROVIDER_SITE_OTHER): Payer: 59 | Admitting: Nurse Practitioner

## 2023-01-06 VITALS — BP 138/88 | HR 85 | Temp 97.8°F | Ht 69.49 in | Wt 305.2 lb

## 2023-01-06 DIAGNOSIS — I10 Essential (primary) hypertension: Secondary | ICD-10-CM | POA: Diagnosis not present

## 2023-01-06 MED ORDER — VALSARTAN 80 MG PO TABS: 80.0000 mg | ORAL_TABLET | Freq: Every day | ORAL | 3 refills | Status: DC

## 2023-01-06 NOTE — Assessment & Plan Note (Signed)
Ongoing with elevations continuing, although some downward trend.  Will increase Valsartan to 80 MG daily, educated him on this and side effects + use of medication.  Discussed at length with him need for lower BP and discussed that he would benefit from new sleep study, as consistent use of CPAP would benefit BP levels.  Can not afford at this time.  Recommend check BP 3 days a week and document levels for provider.  Focus heavily on DASH diet and regular activity.  Return in 5 weeks with BP log and will adjust medication as needed.

## 2023-01-06 NOTE — Progress Notes (Signed)
BP 138/88 (BP Location: Left Arm, Patient Position: Sitting, Cuff Size: Large)   Pulse 85   Temp 97.8 F (36.6 C) (Oral)   Ht 5' 9.49" (1.765 m)   Wt (!) 305 lb 3.2 oz (138.4 kg)   SpO2 96%   BMI 44.44 kg/m    Subjective:    Patient ID: Roger Ray, male    DOB: [birth year], 52 y.o.   MRN: [id number]  HPI: Roger Ray is a 52 y.o. male  Chief Complaint  Patient presents with   Hypertension    Started Valsartan 40 mg at last visit   HYPERTENSION  Started on Valsartan on 11/25/22.  He has noticed no side effects with this.  He has been working on diet changes, reducing salt.  Trying to reduce stressors, wife has taken over bills. Hypertension status: uncontrolled  Satisfied with current treatment? yes Duration of hypertension: chronic BP monitoring frequency:  daily BP range: 140-150/90 BP medication side effects:  no Medication compliance: good compliance Aspirin: no Recurrent headaches: no Visual changes: no Palpitations: no Dyspnea: no Chest pain: no Lower extremity edema: no Dizzy/lightheaded: no     01/06/2023    3:52 PM 11/25/2022    3:39 PM 10/17/2022    3:38 PM 04/18/2022    2:37 PM  Depression screen PHQ 2/9  Decreased Interest 0 1 0 1  Down, Depressed, Hopeless 1 1 0 2  PHQ - 2 Score 1 2 0 3  Altered sleeping 1 2 2 3   Tired, decreased energy 1 1 1 2   Change in appetite 0 0 0 1  Feeling bad or failure about yourself  1 1 1 2   Trouble concentrating 1 0 0 1  Moving slowly or fidgety/restless 0 0 0 0  Suicidal thoughts 0 0  0  PHQ-9 Score 5 6 4 12   Difficult doing work/chores Somewhat difficult Somewhat difficult Somewhat difficult Somewhat difficult       01/06/2023    3:53 PM 11/25/2022    3:39 PM 10/17/2022    3:38 PM 04/18/2022    2:37 PM  GAD 7 : Generalized Anxiety Score  Nervous, Anxious, on Edge 1 1 0 3  Control/stop worrying 1 1 1 2   Worry too much - different things 1 1 1 2   Trouble relaxing 1 1 1 1   Restless 1 0 0 1  Easily annoyed or  irritable 1  1 2   Afraid - awful might happen 1 0 0 2  Total GAD 7 Score 7  4 13   Anxiety Difficulty Somewhat difficult Somewhat difficult Somewhat difficult Somewhat difficult   Relevant past medical, surgical, family and social history reviewed and updated as indicated. Interim medical history since our last visit reviewed. Allergies and medications reviewed and updated.  Review of Systems  Constitutional:  Negative for activity change, diaphoresis, fatigue and fever.  Respiratory:  Negative for cough, chest tightness, shortness of breath and wheezing.   Cardiovascular:  Negative for chest pain, palpitations and leg swelling.  Gastrointestinal: Negative.   Neurological: Negative.   Psychiatric/Behavioral: Negative.      Per HPI unless specifically indicated above     Objective:    BP 138/88 (BP Location: Left Arm, Patient Position: Sitting, Cuff Size: Large)   Pulse 85   Temp 97.8 F (36.6 C) (Oral)   Ht 5' 9.49" (1.765 m)   Wt (!) 305 lb 3.2 oz (138.4 kg)   SpO2 96%   BMI 44.44 kg/m   Wt Readings from Last 3  Encounters:  01/06/23 (!) 305 lb 3.2 oz (138.4 kg)  11/25/22 (!) 306 lb 9.6 oz (139.1 kg)  10/17/22 (!) 309 lb 8 oz (140.4 kg)    Physical Exam Vitals and nursing note reviewed.  Constitutional:      General: He is awake. He is not in acute distress.    Appearance: He is well-developed and well-groomed. He is obese. He is not ill-appearing or toxic-appearing.  HENT:     Head: Normocephalic and atraumatic.     Right Ear: Hearing normal. No drainage.     Left Ear: Hearing normal. No drainage.  Eyes:     General: Lids are normal.        Right eye: No discharge.        Left eye: No discharge.     Conjunctiva/sclera: Conjunctivae normal.     Pupils: Pupils are equal, round, and reactive to light.  Neck:     Thyroid: No thyromegaly.     Vascular: No carotid bruit.  Cardiovascular:     Rate and Rhythm: Normal rate and regular rhythm.     Heart sounds: Normal  heart sounds, S1 normal and S2 normal. No murmur heard.    No gallop.  Pulmonary:     Effort: Pulmonary effort is normal. No accessory muscle usage or respiratory distress.     Breath sounds: Normal breath sounds.  Abdominal:     General: Bowel sounds are normal.     Palpations: Abdomen is soft. There is no hepatomegaly or splenomegaly.  Musculoskeletal:        General: Normal range of motion.     Cervical back: Normal range of motion and neck supple.     Right lower leg: No edema.     Left lower leg: No edema.  Lymphadenopathy:     Cervical: No cervical adenopathy.  Skin:    General: Skin is warm and dry.     Capillary Refill: Capillary refill takes less than 2 seconds.  Neurological:     Mental Status: He is alert and oriented to person, place, and time.     Deep Tendon Reflexes: Reflexes are normal and symmetric.  Psychiatric:        Attention and Perception: Attention normal.        Mood and Affect: Mood normal.        Speech: Speech normal.        Behavior: Behavior normal. Behavior is cooperative.        Thought Content: Thought content normal.     Results for orders placed or performed in visit on 10/17/22  Comprehensive metabolic panel  Result Value Ref Range   Glucose 87 70 - 99 mg/dL   BUN 14 6 - 24 mg/dL   Creatinine, Ser 0.98 0.76 - 1.27 mg/dL   eGFR 96 >11 BJ/YNW/2.95   BUN/Creatinine Ratio 15 9 - 20   Sodium 141 134 - 144 mmol/L   Potassium 4.2 3.5 - 5.2 mmol/L   Chloride 102 96 - 106 mmol/L   CO2 22 20 - 29 mmol/L   Calcium 9.7 8.7 - 10.2 mg/dL   Total Protein 7.7 6.0 - 8.5 g/dL   Albumin 4.7 3.8 - 4.9 g/dL   Globulin, Total 3.0 1.5 - 4.5 g/dL   Albumin/Globulin Ratio 1.6 1.2 - 2.2   Bilirubin Total 0.4 0.0 - 1.2 mg/dL   Alkaline Phosphatase 79 44 - 121 IU/L   AST 21 0 - 40 IU/L   ALT 20 0 - 44  IU/L  Lipid Panel w/o Chol/HDL Ratio  Result Value Ref Range   Cholesterol, Total 252 (H) 100 - 199 mg/dL   Triglycerides 161 (H) 0 - 149 mg/dL   HDL 54  >09 mg/dL   VLDL Cholesterol Cal 36 5 - 40 mg/dL   LDL Chol Calc (NIH) 604 (H) 0 - 99 mg/dL      Assessment & Plan:   Problem List Items Addressed This Visit       Cardiovascular and Mediastinum   Essential hypertension - Primary    Ongoing with elevations continuing, although some downward trend.  Will increase Valsartan to 80 MG daily, educated him on this and side effects + use of medication.  Discussed at length with him need for lower BP and discussed that he would benefit from new sleep study, as consistent use of CPAP would benefit BP levels.  Can not afford at this time.  Recommend check BP 3 days a week and document levels for provider.  Focus heavily on DASH diet and regular activity.  Return in 5 weeks with BP log and will adjust medication as needed.      Relevant Medications   valsartan (DIOVAN) 80 MG tablet     Follow up plan: Return in about 5 weeks (around 02/10/2023) for HTN -- increased Valsartan to 80 MG.

## 2023-02-10 ENCOUNTER — Encounter: Payer: Self-pay | Admitting: Nurse Practitioner

## 2023-02-10 ENCOUNTER — Ambulatory Visit (INDEPENDENT_AMBULATORY_CARE_PROVIDER_SITE_OTHER): Payer: 59 | Admitting: Nurse Practitioner

## 2023-02-10 VITALS — BP 138/86 | HR 85 | Temp 97.6°F | Ht 69.49 in | Wt 306.4 lb

## 2023-02-10 DIAGNOSIS — Z6841 Body Mass Index (BMI) 40.0 and over, adult: Secondary | ICD-10-CM

## 2023-02-10 DIAGNOSIS — I10 Essential (primary) hypertension: Secondary | ICD-10-CM | POA: Diagnosis not present

## 2023-02-10 MED ORDER — VALSARTAN 160 MG PO TABS: 160.0000 mg | ORAL_TABLET | Freq: Every day | ORAL | 4 refills | Status: DC

## 2023-02-10 NOTE — Assessment & Plan Note (Signed)
BMI 44.61.  Recommended eating smaller high protein, low fat meals more frequently and exercising 30 mins a day 5 times a week with a goal of 10-15lb weight loss in the next 3 months. Patient voiced their understanding and motivation to adhere to these recommendations.

## 2023-02-10 NOTE — Assessment & Plan Note (Signed)
Ongoing with elevations continuing, although these are trending down at home and in office.  Will increase Valsartan to 160 MG daily, educated him on this and side effects + use of medication.  Discussed at length with him need for lower BP and discussed that he would benefit from new sleep study, as consistent use of CPAP would benefit BP levels.  Can not afford at this time.  Recommend check BP 3 days a week and document levels for provider.  Focus heavily on DASH diet and regular activity.  Return in 6 weeks with BP log and will adjust medication as needed.

## 2023-02-10 NOTE — Addendum Note (Signed)
Addended by: Aura Dials T on: 02/10/2023 03:29 PM   Modules accepted: Orders

## 2023-02-10 NOTE — Patient Instructions (Signed)

## 2023-02-10 NOTE — Progress Notes (Signed)
BP 138/86 (BP Location: Left Arm, Patient Position: Sitting)   Pulse 85   Temp 97.6 F (36.4 C) (Oral)   Ht 5' 9.49" (1.765 m)   Wt (!) 306 lb 6.4 oz (139 kg)   SpO2 96%   BMI 44.61 kg/m    Subjective:    Patient ID: Roger Ray, male    DOB: 11/28/1970, 52 y.o.   MRN: [id number]  HPI: Roger Ray is a 52 y.o. male  Chief Complaint  Patient presents with   Hypertension    Increased valsartan at last visit to 80.mg   HYPERTENSION without Chronic Kidney Disease Follow-up today for medication, Valsartan, increase to 80 MG on 01/06/23.  Tolerating this well. Hypertension status: stable  Satisfied with current treatment? yes Duration of hypertension: chronic BP monitoring frequency:  a few times a week BP range: 140/80-90 BP medication side effects:  no Medication compliance: good compliance Aspirin: no Recurrent headaches: no Visual changes: no Palpitations: no Dyspnea: no Chest pain: no Lower extremity edema: no Dizzy/lightheaded: no   Relevant past medical, surgical, family and social history reviewed and updated as indicated. Interim medical history since our last visit reviewed. Allergies and medications reviewed and updated.  Review of Systems  Constitutional:  Negative for activity change, diaphoresis, fatigue and fever.  Respiratory:  Negative for cough, chest tightness, shortness of breath and wheezing.   Cardiovascular:  Negative for chest pain, palpitations and leg swelling.  Gastrointestinal: Negative.   Neurological: Negative.   Psychiatric/Behavioral: Negative.      Per HPI unless specifically indicated above     Objective:    BP 138/86 (BP Location: Left Arm, Patient Position: Sitting)   Pulse 85   Temp 97.6 F (36.4 C) (Oral)   Ht 5' 9.49" (1.765 m)   Wt (!) 306 lb 6.4 oz (139 kg)   SpO2 96%   BMI 44.61 kg/m   Wt Readings from Last 3 Encounters:  02/10/23 (!) 306 lb 6.4 oz (139 kg)  01/06/23 (!) 305 lb 3.2 oz (138.4 kg)  11/25/22  (!) 306 lb 9.6 oz (139.1 kg)    Physical Exam Vitals and nursing note reviewed.  Constitutional:      General: He is awake. He is not in acute distress.    Appearance: He is well-developed and well-groomed. He is obese. He is not ill-appearing or toxic-appearing.  HENT:     Head: Normocephalic and atraumatic.     Right Ear: Hearing normal. No drainage.     Left Ear: Hearing normal. No drainage.  Eyes:     General: Lids are normal.        Right eye: No discharge.        Left eye: No discharge.     Conjunctiva/sclera: Conjunctivae normal.     Pupils: Pupils are equal, round, and reactive to light.  Neck:     Thyroid: No thyromegaly.     Vascular: No carotid bruit.  Cardiovascular:     Rate and Rhythm: Normal rate and regular rhythm.     Heart sounds: Normal heart sounds, S1 normal and S2 normal. No murmur heard.    No gallop.  Pulmonary:     Effort: Pulmonary effort is normal. No accessory muscle usage or respiratory distress.     Breath sounds: Normal breath sounds.  Abdominal:     General: Bowel sounds are normal. There is no distension.     Palpations: Abdomen is soft.     Tenderness: There is no abdominal tenderness.  Musculoskeletal:        General: Normal range of motion.     Cervical back: Normal range of motion and neck supple.     Right lower leg: No edema.     Left lower leg: No edema.  Lymphadenopathy:     Cervical: No cervical adenopathy.  Skin:    General: Skin is warm and dry.     Capillary Refill: Capillary refill takes less than 2 seconds.  Neurological:     Mental Status: He is alert and oriented to person, place, and time.     Deep Tendon Reflexes: Reflexes are normal and symmetric.  Psychiatric:        Attention and Perception: Attention normal.        Mood and Affect: Mood normal.        Speech: Speech normal.        Behavior: Behavior normal. Behavior is cooperative.        Thought Content: Thought content normal.     Results for orders placed  or performed in visit on 10/17/22  Comprehensive metabolic panel  Result Value Ref Range   Glucose 87 70 - 99 mg/dL   BUN 14 6 - 24 mg/dL   Creatinine, Ser 1.61 0.76 - 1.27 mg/dL   eGFR 96 >09 UE/AVW/0.98   BUN/Creatinine Ratio 15 9 - 20   Sodium 141 134 - 144 mmol/L   Potassium 4.2 3.5 - 5.2 mmol/L   Chloride 102 96 - 106 mmol/L   CO2 22 20 - 29 mmol/L   Calcium 9.7 8.7 - 10.2 mg/dL   Total Protein 7.7 6.0 - 8.5 g/dL   Albumin 4.7 3.8 - 4.9 g/dL   Globulin, Total 3.0 1.5 - 4.5 g/dL   Albumin/Globulin Ratio 1.6 1.2 - 2.2   Bilirubin Total 0.4 0.0 - 1.2 mg/dL   Alkaline Phosphatase 79 44 - 121 IU/L   AST 21 0 - 40 IU/L   ALT 20 0 - 44 IU/L  Lipid Panel w/o Chol/HDL Ratio  Result Value Ref Range   Cholesterol, Total 252 (H) 100 - 199 mg/dL   Triglycerides 119 (H) 0 - 149 mg/dL   HDL 54 >14 mg/dL   VLDL Cholesterol Cal 36 5 - 40 mg/dL   LDL Chol Calc (NIH) 782 (H) 0 - 99 mg/dL      Assessment & Plan:   Problem List Items Addressed This Visit       Cardiovascular and Mediastinum   Essential hypertension    Ongoing with elevations continuing, although these are trending down at home and in office.  Will increase Valsartan to 160 MG daily, educated him on this and side effects + use of medication.  Discussed at length with him need for lower BP and discussed that he would benefit from new sleep study, as consistent use of CPAP would benefit BP levels.  Can not afford at this time.  Recommend check BP 3 days a week and document levels for provider.  Focus heavily on DASH diet and regular activity.  Return in 6 weeks with BP log and will adjust medication as needed.      Relevant Medications   valsartan (DIOVAN) 160 MG tablet   Other Relevant Orders   Basic metabolic panel     Other   Obesity - Primary    BMI 44.61.  Recommended eating smaller high protein, low fat meals more frequently and exercising 30 mins a day 5 times a week with a goal of 10-15lb  weight loss in the next 3  months. Patient voiced their understanding and motivation to adhere to these recommendations.          Follow up plan: Return in about 6 weeks (around 03/24/2023) for HTN -- increase Valsartan to 160 MG.

## 2023-03-24 ENCOUNTER — Ambulatory Visit: Payer: 59 | Admitting: Nurse Practitioner

## 2023-04-23 NOTE — Patient Instructions (Signed)
Be Involved in Caring For Your Health:  Taking Medications When medications are taken as directed, they can greatly improve your health. But if they are not taken as prescribed, they may not work. In some cases, not taking them correctly can be harmful. To help ensure your treatment remains effective and safe, understand your medications and how to take them. Bring your medications to each visit for review by your provider.  Your lab results, notes, and after visit summary will be available on My Chart. We strongly encourage you to use this feature. If lab results are abnormal the clinic will contact you with the appropriate steps. If the clinic does not contact you assume the results are satisfactory. You can always view your results on My Chart. If you have questions regarding your health or results, please contact the clinic during office hours. You can also ask questions on My Chart.  We at [facility] are grateful that you chose us to provide your care. We strive to provide evidence-based and compassionate care and are always looking for feedback. If you get a survey from the clinic please complete this so we can hear your opinions.  DASH Eating Plan DASH stands for Dietary Approaches to Stop Hypertension. The DASH eating plan is a healthy eating plan that has been shown to: Lower high blood pressure (hypertension). Reduce your risk for type 2 diabetes, heart disease, and stroke. Help with weight loss. What are tips for following this plan? Reading food labels Check food labels for the amount of salt (sodium) per serving. Choose foods with less than 5 percent of the Daily Value (DV) of sodium. In general, foods with less than 300 milligrams (mg) of sodium per serving fit into this eating plan. To find whole grains, look for the word "whole" as the first word in the ingredient list. Shopping Buy products labeled as "low-sodium" or "no salt added." Buy fresh foods. Avoid canned  foods and pre-made or frozen meals. Cooking Try not to add salt when you cook. Use salt-free seasonings or herbs instead of table salt or sea salt. Check with your health care provider or pharmacist before using salt substitutes. Do not fry foods. Cook foods in healthy ways, such as baking, boiling, grilling, roasting, or broiling. Cook using oils that are good for your heart. These include olive, canola, avocado, soybean, and sunflower oil. Meal planning  Eat a balanced diet. This should include: 4 or more servings of fruits and 4 or more servings of vegetables each day. Try to fill half of your plate with fruits and vegetables. 6-8 servings of whole grains each day. 6 or less servings of lean meat, poultry, or fish each day. 1 oz is 1 serving. A 3 oz (85 g) serving of meat is about the same size as the palm of your hand. One egg is 1 oz (28 g). 2-3 servings of low-fat dairy each day. One serving is 1 cup (237 mL). 1 serving of nuts, seeds, or beans 5 times each week. 2-3 servings of heart-healthy fats. Healthy fats called omega-3 fatty acids are found in foods such as walnuts, flaxseeds, fortified milks, and eggs. These fats are also found in cold-water fish, such as sardines, salmon, and mackerel. Limit how much you eat of: Canned or prepackaged foods. Food that is high in trans fat, such as fried foods. Food that is high in saturated fat, such as fatty meat. Desserts and other sweets, sugary drinks, and other foods with added sugar. Full-fat   dairy products. Do not salt foods before eating. Do not eat more than 4 egg yolks a week. Try to eat at least 2 vegetarian meals a week. Eat more home-cooked food and less restaurant, buffet, and fast food. Lifestyle When eating at a restaurant, ask if your food can be made with less salt or no salt. If you drink alcohol: Limit how much you have to: 0-1 drink a day if you are male. 0-2 drinks a day if you are male. Know how much alcohol is in  your drink. In the [place] one drink is one 12 oz bottle of beer (355 mL), one 5 oz glass of wine (148 mL), or one 1 oz glass of hard liquor (44 mL). General information Avoid eating more than 2,300 mg of salt a day. If you have hypertension, you may need to reduce your sodium intake to 1,500 mg a day. Work with your provider to stay at a healthy body weight or lose weight. Ask what the best weight range is for you. On most days of the week, get at least 30 minutes of exercise that causes your heart to beat faster. This may include walking, swimming, or biking. Work with your provider or dietitian to adjust your eating plan to meet your specific calorie needs. What foods should I eat? Fruits All fresh, dried, or frozen fruit. Canned fruits that are in their natural juice and do not have sugar added to them. Vegetables Fresh or frozen vegetables that are raw, steamed, roasted, or grilled. Low-sodium or reduced-sodium tomato and vegetable juice. Low-sodium or reduced-sodium tomato sauce and tomato paste. Low-sodium or reduced-sodium canned vegetables. Grains Whole-grain or whole-wheat bread. Whole-grain or whole-wheat pasta. Brown rice. Oatmeal. Quinoa. Bulgur. Whole-grain and low-sodium cereals. Pita bread. Low-fat, low-sodium crackers. Whole-wheat flour tortillas. Meats and other proteins Skinless chicken or turkey. Ground chicken or turkey. Pork with fat trimmed off. Fish and seafood. Egg whites. Dried beans, peas, or lentils. Unsalted nuts, nut butters, and seeds. Unsalted canned beans. Lean cuts of beef with fat trimmed off. Low-sodium, lean precooked or cured meat, such as sausages or meat loaves. Dairy Low-fat (1%) or fat-free (skim) milk. Reduced-fat, low-fat, or fat-free cheeses. Nonfat, low-sodium ricotta or cottage cheese. Low-fat or nonfat yogurt. Low-fat, low-sodium cheese. Fats and oils Soft margarine without trans fats. Vegetable oil. Reduced-fat, low-fat, or light mayonnaise and salad  dressings (reduced-sodium). Canola, safflower, olive, avocado, soybean, and sunflower oils. Avocado. Seasonings and condiments Herbs. Spices. Seasoning mixes without salt. Other foods Unsalted popcorn and pretzels. Fat-free sweets. The items listed above may not be all the foods and drinks you can have. Talk to a dietitian to learn more. What foods should I avoid? Fruits Canned fruit in a light or heavy syrup. Fried fruit. Fruit in cream or butter sauce. Vegetables Creamed or fried vegetables. Vegetables in a cheese sauce. Regular canned vegetables that are not marked as low-sodium or reduced-sodium. Regular canned tomato sauce and paste that are not marked as low-sodium or reduced-sodium. Regular tomato and vegetable juices that are not marked as low-sodium or reduced-sodium. Pickles. Olives. Grains Baked goods made with fat, such as croissants, muffins, or some breads. Dry pasta or rice meal packs. Meats and other proteins Fatty cuts of meat. Ribs. Fried meat. Bacon. Bologna, salami, and other precooked or cured meats, such as sausages or meat loaves, that are not lean and low in sodium. Fat from the back of a pig (fatback). Bratwurst. Salted nuts and seeds. Canned beans with added salt. Canned   or smoked fish. Whole eggs or egg yolks. Chicken or turkey with skin. Dairy Whole or 2% milk, cream, and half-and-half. Whole or full-fat cream cheese. Whole-fat or sweetened yogurt. Full-fat cheese. Nondairy creamers. Whipped toppings. Processed cheese and cheese spreads. Fats and oils Butter. Stick margarine. Lard. Shortening. Ghee. Bacon fat. Tropical oils, such as coconut, palm kernel, or palm oil. Seasonings and condiments Onion salt, garlic salt, seasoned salt, table salt, and sea salt. Worcestershire sauce. Tartar sauce. Barbecue sauce. Teriyaki sauce. Soy sauce, including reduced-sodium soy sauce. Steak sauce. Canned and packaged gravies. Fish sauce. Oyster sauce. Cocktail sauce. Store-bought  horseradish. Ketchup. Mustard. Meat flavorings and tenderizers. Bouillon cubes. Hot sauces. Pre-made or packaged marinades. Pre-made or packaged taco seasonings. Relishes. Regular salad dressings. Other foods Salted popcorn and pretzels. The items listed above may not be all the foods and drinks you should avoid. Talk to a dietitian to learn more. Where to find more information National Heart, Lung, and Blood Institute (NHLBI): nhlbi.nih.gov American Heart Association (AHA): heart.org Academy of Nutrition and Dietetics: eatright.org National Kidney Foundation (NKF): kidney.org This information is not intended to replace advice given to you by your health care provider. Make sure you discuss any questions you have with your health care provider. Document Revised: 07/28/2022 Document Reviewed: 07/28/2022 Elsevier Patient Education  2024 Elsevier Inc.  

## 2023-04-27 ENCOUNTER — Other Ambulatory Visit: Payer: Self-pay | Admitting: Nurse Practitioner

## 2023-04-27 NOTE — Telephone Encounter (Signed)
Requested Prescriptions  Pending Prescriptions Disp Refills   sertraline (ZOLOFT) 100 MG tablet [Pharmacy Med Name: SERTRALINE 100 MG TAB[*]] 180 tablet 0    Sig: TAKE TWO TABLETS BY MOUTH ONE TIME DAILY     Psychiatry:  Antidepressants - SSRI - sertraline Passed - 04/27/2023 12:11 AM      Passed - AST in normal range and within 360 days    AST  Date Value Ref Range Status  10/17/2022 21 0 - 40 IU/L Final         Passed - ALT in normal range and within 360 days    ALT  Date Value Ref Range Status  10/17/2022 20 0 - 44 IU/L Final         Passed - Completed PHQ-2 or PHQ-9 in the last 360 days      Passed - Valid encounter within last 6 months    Recent Outpatient Visits           2 months ago Class 3 severe obesity due to excess calories without serious comorbidity with body mass index (BMI) of 45.0 to 49.9 in adult [facility])   [place] [facility] [place], Corrie Dandy T, NP   3 months ago Essential hypertension   [place] [facility] [place], [place] T, NP   5 months ago Essential hypertension   [place] [facility] [place], [place] T, NP   6 months ago Recurrent major depressive disorder, in partial remission (HCC)   [place] [facility] [place], [place] T, NP   1 year ago Current moderate episode of major depressive disorder without prior episode (HCC)   [place] [facility] [place], Dorie Rank, NP       Future Appointments             Tomorrow Marjie Skiff, NP [facility], [facility]

## 2023-04-28 ENCOUNTER — Encounter: Payer: Self-pay | Admitting: Nurse Practitioner

## 2023-04-28 ENCOUNTER — Ambulatory Visit (INDEPENDENT_AMBULATORY_CARE_PROVIDER_SITE_OTHER): Payer: 59 | Admitting: Nurse Practitioner

## 2023-04-28 VITALS — BP 115/73 | HR 88 | Temp 97.6°F | Ht 69.49 in | Wt 298.0 lb

## 2023-04-28 DIAGNOSIS — I10 Essential (primary) hypertension: Secondary | ICD-10-CM

## 2023-04-28 MED ORDER — SERTRALINE HCL 100 MG PO TABS: 200.0000 mg | ORAL_TABLET | Freq: Every day | ORAL | 4 refills | Status: DC

## 2023-04-28 NOTE — Progress Notes (Signed)
BP 115/73   Pulse 88   Temp 97.6 F (36.4 C)   Ht 5' 9.49" (1.765 m)   Wt 298 lb (135.2 kg)   SpO2 98%   BMI 43.39 kg/m    Subjective:    Patient ID: Roger Ray, male    DOB: 05-08-1971, 52 y.o.   MRN: [id number]  HPI: Roger Ray is a 52 y.o. male  Chief Complaint  Patient presents with   Follow-up   HYPERTENSION without Chronic Kidney Disease Follow-up today for medication, Valsartan, increase to 160 MG on 02/10/23.  Tolerating this well. Hypertension status: stable  Satisfied with current treatment? yes Duration of hypertension: chronic BP monitoring frequency:  twice a week BP range: 120/80 range BP medication side effects:  no Medication compliance: good compliance Aspirin: no Recurrent headaches: no Visual changes: no Palpitations: no Dyspnea: no Chest pain: no Lower extremity edema: no Dizzy/lightheaded: no   Relevant past medical, surgical, family and social history reviewed and updated as indicated. Interim medical history since our last visit reviewed. Allergies and medications reviewed and updated.  Review of Systems  Constitutional:  Negative for activity change, diaphoresis, fatigue and fever.  Respiratory:  Negative for cough, chest tightness, shortness of breath and wheezing.   Cardiovascular:  Negative for chest pain, palpitations and leg swelling.  Gastrointestinal: Negative.   Neurological: Negative.   Psychiatric/Behavioral: Negative.      Per HPI unless specifically indicated above     Objective:    BP 115/73   Pulse 88   Temp 97.6 F (36.4 C)   Ht 5' 9.49" (1.765 m)   Wt 298 lb (135.2 kg)   SpO2 98%   BMI 43.39 kg/m   Wt Readings from Last 3 Encounters:  04/28/23 298 lb (135.2 kg)  02/10/23 (!) 306 lb 6.4 oz (139 kg)  01/06/23 (!) 305 lb 3.2 oz (138.4 kg)    Physical Exam Vitals and nursing note reviewed.  Constitutional:      General: He is awake. He is not in acute distress.    Appearance: He is well-developed  and well-groomed. He is obese. He is not ill-appearing or toxic-appearing.  HENT:     Head: Normocephalic and atraumatic.     Right Ear: Hearing normal. No drainage.     Left Ear: Hearing normal. No drainage.  Eyes:     General: Lids are normal.        Right eye: No discharge.        Left eye: No discharge.     Conjunctiva/sclera: Conjunctivae normal.     Pupils: Pupils are equal, round, and reactive to light.  Neck:     Thyroid: No thyromegaly.     Vascular: No carotid bruit.  Cardiovascular:     Rate and Rhythm: Normal rate and regular rhythm.     Heart sounds: Normal heart sounds, S1 normal and S2 normal. No murmur heard.    No gallop.  Pulmonary:     Effort: Pulmonary effort is normal. No accessory muscle usage or respiratory distress.     Breath sounds: Normal breath sounds.  Abdominal:     General: Bowel sounds are normal. There is no distension.     Palpations: Abdomen is soft.     Tenderness: There is no abdominal tenderness.  Musculoskeletal:        General: Normal range of motion.     Cervical back: Normal range of motion and neck supple.     Right lower leg: No edema.  Left lower leg: No edema.  Lymphadenopathy:     Cervical: No cervical adenopathy.  Skin:    General: Skin is warm and dry.     Capillary Refill: Capillary refill takes less than 2 seconds.  Neurological:     Mental Status: He is alert and oriented to person, place, and time.     Deep Tendon Reflexes: Reflexes are normal and symmetric.  Psychiatric:        Attention and Perception: Attention normal.        Mood and Affect: Mood normal.        Speech: Speech normal.        Behavior: Behavior normal. Behavior is cooperative.        Thought Content: Thought content normal.     Results for orders placed or performed in visit on 10/17/22  Comprehensive metabolic panel  Result Value Ref Range   Glucose 87 70 - 99 mg/dL   BUN 14 6 - 24 mg/dL   Creatinine, Ser 0.98 0.76 - 1.27 mg/dL   eGFR 96  >11 BJ/YNW/2.95   BUN/Creatinine Ratio 15 9 - 20   Sodium 141 134 - 144 mmol/L   Potassium 4.2 3.5 - 5.2 mmol/L   Chloride 102 96 - 106 mmol/L   CO2 22 20 - 29 mmol/L   Calcium 9.7 8.7 - 10.2 mg/dL   Total Protein 7.7 6.0 - 8.5 g/dL   Albumin 4.7 3.8 - 4.9 g/dL   Globulin, Total 3.0 1.5 - 4.5 g/dL   Albumin/Globulin Ratio 1.6 1.2 - 2.2   Bilirubin Total 0.4 0.0 - 1.2 mg/dL   Alkaline Phosphatase 79 44 - 121 IU/L   AST 21 0 - 40 IU/L   ALT 20 0 - 44 IU/L  Lipid Panel w/o Chol/HDL Ratio  Result Value Ref Range   Cholesterol, Total 252 (H) 100 - 199 mg/dL   Triglycerides 621 (H) 0 - 149 mg/dL   HDL 54 >30 mg/dL   VLDL Cholesterol Cal 36 5 - 40 mg/dL   LDL Chol Calc (NIH) 865 (H) 0 - 99 mg/dL      Assessment & Plan:   Problem List Items Addressed This Visit       Cardiovascular and Mediastinum   Essential hypertension - Primary    Ongoing and improved.  Now at goal.  Will continue Valsartan 160 MG daily, educated him on this and side effects + use of medication.  Discussed at length with him need for lower BP and discussed that he would benefit from new sleep study, as consistent use of CPAP would benefit BP levels.  Cannot afford at this time.  Recommend check BP 3 days a week and document levels for provider.  Focus heavily on DASH diet and regular activity.  LABS: BMP today.  Return in 6 months.      Relevant Orders   Basic metabolic panel      Follow up plan: Return in about 6 months (around 10/27/2023) for HTN/HLD, MOOD, OSA.

## 2023-04-28 NOTE — Assessment & Plan Note (Signed)
Ongoing and improved.  Now at goal.  Will continue Valsartan 160 MG daily, educated him on this and side effects + use of medication.  Discussed at length with him need for lower BP and discussed that he would benefit from new sleep study, as consistent use of CPAP would benefit BP levels.  Cannot afford at this time.  Recommend check BP 3 days a week and document levels for provider.  Focus heavily on DASH diet and regular activity.  LABS: BMP today.  Return in 6 months.

## 2023-04-29 LAB — BASIC METABOLIC PANEL
BUN/Creatinine Ratio: 20 (ref 9–20)
BUN: 16 mg/dL (ref 6–24)
CO2: 21 mmol/L (ref 20–29)
Calcium: 10 mg/dL (ref 8.7–10.2)
Chloride: 106 mmol/L (ref 96–106)
Creatinine, Ser: 0.81 mg/dL (ref 0.76–1.27)
Glucose: 95 mg/dL (ref 70–99)
Potassium: 4.2 mmol/L (ref 3.5–5.2)
Sodium: 143 mmol/L (ref 134–144)
eGFR: 107 mL/min/{1.73_m2} (ref >59)

## 2023-04-29 NOTE — Progress Notes (Signed)
Contacted via MyChart   Your kidney function and electrolytes look great!!  Continue current medications.  Any questions? Keep being excellent!!  Thank you for allowing me to participate in your care.  I appreciate you. Kindest regards, Jayzen Paver

## 2023-10-05 ENCOUNTER — Encounter: Payer: Self-pay | Admitting: Nurse Practitioner

## 2023-10-21 NOTE — Patient Instructions (Signed)
 Be Involved in Caring For Your Health:  Taking Medications When medications are taken as directed, they can greatly improve your health. But if they are not taken as prescribed, they may not work. In some cases, not taking them correctly can be harmful. To help ensure your treatment remains effective and safe, understand your medications and how to take them. Bring your medications to each visit for review by your provider.  Your lab results, notes, and after visit summary will be available on My Chart. We strongly encourage you to use this feature. If lab results are abnormal the clinic will contact you with the appropriate steps. If the clinic does not contact you assume the results are satisfactory. You can always view your results on My Chart. If you have questions regarding your health or results, please contact the clinic during office hours. You can also ask questions on My Chart.  We at [facility] are grateful that you chose [place] to provide your care. We strive to provide evidence-based and compassionate care and are always looking for feedback. If you get a survey from the clinic please complete this so we can hear your opinions.  DASH Eating Plan DASH stands for Dietary Approaches to Stop Hypertension. The DASH eating plan is a healthy eating plan that has been shown to: Lower high blood pressure (hypertension). Reduce your risk for type 2 diabetes, heart disease, and stroke. Help with weight loss. What are tips for following this plan? Reading food labels Check food labels for the amount of salt (sodium) per serving. Choose foods with less than 5 percent of the Daily Value (DV) of sodium. In general, foods with less than 300 milligrams (mg) of sodium per serving fit into this eating plan. To find whole grains, look for the word "whole" as the first word in the ingredient list. Shopping Buy products labeled as "low-sodium" or "no salt added." Buy fresh foods. Avoid canned  foods and pre-made or frozen meals. Cooking Try not to add salt when you cook. Use salt-free seasonings or herbs instead of table salt or sea salt. Check with your health care provider or pharmacist before using salt substitutes. Do not fry foods. Cook foods in healthy ways, such as baking, boiling, grilling, roasting, or broiling. Cook using oils that are good for your heart. These include olive, canola, avocado, soybean, and sunflower oil. Meal planning  Eat a balanced diet. This should include: 4 or more servings of fruits and 4 or more servings of vegetables each day. Try to fill half of your plate with fruits and vegetables. 6-8 servings of whole grains each day. 6 or less servings of lean meat, poultry, or fish each day. 1 oz is 1 serving. A 3 oz (85 g) serving of meat is about the same size as the palm of your hand. One egg is 1 oz (28 g). 2-3 servings of low-fat dairy each day. One serving is 1 cup (237 mL). 1 serving of nuts, seeds, or beans 5 times each week. 2-3 servings of heart-healthy fats. Healthy fats called omega-3 fatty acids are found in foods such as walnuts, flaxseeds, fortified milks, and eggs. These fats are also found in cold-water fish, such as sardines, salmon, and mackerel. Limit how much you eat of: Canned or prepackaged foods. Food that is high in trans fat, such as fried foods. Food that is high in saturated fat, such as fatty meat. Desserts and other sweets, sugary drinks, and other foods with added sugar. Full-fat  dairy products. Do not salt foods before eating. Do not eat more than 4 egg yolks a week. Try to eat at least 2 vegetarian meals a week. Eat more home-cooked food and less restaurant, buffet, and fast food. Lifestyle When eating at a restaurant, ask if your food can be made with less salt or no salt. If you drink alcohol: Limit how much you have to: 0-1 drink a day if you are male. 0-2 drinks a day if you are male. Know how much alcohol is in  your drink. In the [place] one drink is one 12 oz bottle of beer (355 mL), one 5 oz glass of wine (148 mL), or one 1 oz glass of hard liquor (44 mL). General information Avoid eating more than 2,300 mg of salt a day. If you have hypertension, you may need to reduce your sodium intake to 1,500 mg a day. Work with your provider to stay at a healthy body weight or lose weight. Ask what the best weight range is for you. On most days of the week, get at least 30 minutes of exercise that causes your heart to beat faster. This may include walking, swimming, or biking. Work with your provider or dietitian to adjust your eating plan to meet your specific calorie needs. What foods should I eat? Fruits All fresh, dried, or frozen fruit. Canned fruits that are in their natural juice and do not have sugar added to them. Vegetables Fresh or frozen vegetables that are raw, steamed, roasted, or grilled. Low-sodium or reduced-sodium tomato and vegetable juice. Low-sodium or reduced-sodium tomato sauce and tomato paste. Low-sodium or reduced-sodium canned vegetables. Grains Whole-grain or whole-wheat bread. Whole-grain or whole-wheat pasta. Brown rice. Orpah Cobb. Bulgur. Whole-grain and low-sodium cereals. Pita bread. Low-fat, low-sodium crackers. Whole-wheat flour tortillas. Meats and other proteins Skinless chicken or [place]. Ground chicken or [place]. Pork with fat trimmed off. Fish and seafood. Egg whites. Dried beans, peas, or lentils. Unsalted nuts, nut butters, and seeds. Unsalted canned beans. Lean cuts of beef with fat trimmed off. Low-sodium, lean precooked or cured meat, such as sausages or meat loaves. Dairy Low-fat (1%) or fat-free (skim) milk. Reduced-fat, low-fat, or fat-free cheeses. Nonfat, low-sodium ricotta or cottage cheese. Low-fat or nonfat yogurt. Low-fat, low-sodium cheese. Fats and oils Soft margarine without trans fats. Vegetable oil. Reduced-fat, low-fat, or light mayonnaise and salad  dressings (reduced-sodium). Canola, safflower, olive, avocado, soybean, and sunflower oils. Avocado. Seasonings and condiments Herbs. Spices. Seasoning mixes without salt. Other foods Unsalted popcorn and pretzels. Fat-free sweets. The items listed above may not be all the foods and drinks you can have. Talk to a dietitian to learn more. What foods should I avoid? Fruits Canned fruit in a light or heavy syrup. Fried fruit. Fruit in cream or butter sauce. Vegetables Creamed or fried vegetables. Vegetables in a cheese sauce. Regular canned vegetables that are not marked as low-sodium or reduced-sodium. Regular canned tomato sauce and paste that are not marked as low-sodium or reduced-sodium. Regular tomato and vegetable juices that are not marked as low-sodium or reduced-sodium. Rosita Fire. Olives. Grains Baked goods made with fat, such as croissants, muffins, or some breads. Dry pasta or rice meal packs. Meats and other proteins Fatty cuts of meat. Ribs. Fried meat. Tomasa Blase. Bologna, salami, and other precooked or cured meats, such as sausages or meat loaves, that are not lean and low in sodium. Fat from the back of a pig (fatback). Bratwurst. Salted nuts and seeds. Canned beans with added salt. Canned  or smoked fish. Whole eggs or egg yolks. Chicken or [place] with skin. Dairy Whole or 2% milk, cream, and half-and-half. Whole or full-fat cream cheese. Whole-fat or sweetened yogurt. Full-fat cheese. Nondairy creamers. Whipped toppings. Processed cheese and cheese spreads. Fats and oils Butter. Stick margarine. Lard. Shortening. Ghee. Bacon fat. Tropical oils, such as coconut, palm kernel, or palm oil. Seasonings and condiments Onion salt, garlic salt, seasoned salt, table salt, and sea salt. Worcestershire sauce. Tartar sauce. Barbecue sauce. Teriyaki sauce. Soy sauce, including reduced-sodium soy sauce. Steak sauce. Canned and packaged gravies. Fish sauce. Oyster sauce. Cocktail sauce. Store-bought  horseradish. Ketchup. Mustard. Meat flavorings and tenderizers. Bouillon cubes. Hot sauces. Pre-made or packaged marinades. Pre-made or packaged taco seasonings. Relishes. Regular salad dressings. Other foods Salted popcorn and pretzels. The items listed above may not be all the foods and drinks you should avoid. Talk to a dietitian to learn more. Where to find more information National Heart, Lung, and Blood Institute (NHLBI): BuffaloDryCleaner.gl American Heart Association (AHA): heart.org Academy of Nutrition and Dietetics: eatright.org National Kidney Foundation (NKF): kidney.org This information is not intended to replace advice given to you by your health care provider. Make sure you discuss any questions you have with your health care provider. Document Revised: 07/28/2022 Document Reviewed: 07/28/2022 Elsevier Patient Education  2024 [employer].

## 2023-10-23 ENCOUNTER — Encounter: Payer: Self-pay | Admitting: Internal Medicine

## 2023-10-23 ENCOUNTER — Ambulatory Visit (INDEPENDENT_AMBULATORY_CARE_PROVIDER_SITE_OTHER): Admitting: Internal Medicine

## 2023-10-23 ENCOUNTER — Ambulatory Visit: Payer: Self-pay

## 2023-10-23 VITALS — BP 126/78 | HR 89 | Ht 69.0 in | Wt 299.5 lb

## 2023-10-23 DIAGNOSIS — G4733 Obstructive sleep apnea (adult) (pediatric): Secondary | ICD-10-CM | POA: Diagnosis not present

## 2023-10-23 DIAGNOSIS — I10 Essential (primary) hypertension: Secondary | ICD-10-CM | POA: Diagnosis not present

## 2023-10-23 DIAGNOSIS — R55 Syncope and collapse: Secondary | ICD-10-CM

## 2023-10-23 DIAGNOSIS — K529 Noninfective gastroenteritis and colitis, unspecified: Secondary | ICD-10-CM | POA: Diagnosis not present

## 2023-10-23 NOTE — Assessment & Plan Note (Signed)
 Blood pressure is well controlled.  Current medications Valsartan.

## 2023-10-23 NOTE — Telephone Encounter (Signed)
 Copied from CRM [id number]. Topic: Clinical - Red Word Triage >> Oct 23, 2023  7:36 AM Franchot Heidelberg wrote: Red Word that prompted transfer to Nurse Triage: Pt fell yesterday and refused  BP: 130/100 (5:30 pm 10/22/2023)  Chief Complaint: fall & HTN Symptoms: fall on 10/22/23, BP 119/93 at 0745. dizziness Frequency: yesterday Pertinent Negatives: Patient denies n/v Disposition: [] ED /[] Urgent Care (no appt availability in office) / [x] Appointment(In office/virtual)/ []  [facility]/ [] Home Care/ [] Refused Recommended Disposition /[] [place]/ []  Follow-up with PCP Additional Notes: yesterday pt felt sick to stomach, went to bathroom had explosive diarrhea, passed out and fell from toilet: left side of body landed on floor and head landed on bathroom mat.  Rescue came out & pt refused to go to hospital.  BP high yesterday 130/100 @1730 . Pt has been experiencing high BP ever since & today BP 119/93 HR 95.  Pt missed x1 BP medication yesterday.   bruise on left elbow. Wife stated pt could have been dehydrated yesterday & she has been pushing fluids since then.  Reason for Disposition  Systolic BP  >= 180 OR Diastolic >= 110    High BP & pt passed out & fell 10/22/23: needs to be evaluated by provider  [1] All other patients AND [2] now alert and feels fine  (Exception: SIMPLE FAINT due to stress, pain, prolonged standing, or suddenly standing)  Answer Assessment - Initial Assessment Questions 1. MECHANISM: "How did the fall happen?"     N/a 2. DOMESTIC VIOLENCE AND ELDER ABUSE SCREENING: "Did you fall because someone pushed you or tried to hurt you?" If Yes, ask: "Are you safe now?"     N/a 3. ONSET: "When did the fall happen?" (e.g., minutes, hours, or days ago)     yesterday 4. LOCATION: "What part of the body hit the ground?" (e.g., back, buttocks, head, hips, knees, hands, head, stomach)     Left side of body hip,legs hit floor, head landed on bath mat 5. INJURY: "Did  you hurt (injure) yourself when you fell?" If Yes, ask: "What did you injure? Tell me more about this?" (e.g., body area; type of injury; pain severity)"     Bruise on left elbow 6. PAIN: "Is there any pain?" If Yes, ask: "How bad is the pain?" (e.g., Scale 1-10; or mild,  moderate, severe)   - NONE (0): No pain   - MILD (1-3): Doesn't interfere with normal activities    - MODERATE (4-7): Interferes with normal activities or awakens from sleep    - SEVERE (8-10): Excruciating pain, unable to do any normal activities      2/10 soreness all over 7. SIZE: For cuts, bruises, or swelling, ask: "How large is it?" (e.g., inches or centimeters)      Bruise on left elbow:  8. PREGNANCY: "Is there any chance you are pregnant?" "When was your last menstrual period?"     N/a 9. OTHER SYMPTOMS: "Do you have any other symptoms?" (e.g., dizziness, fever, weakness; new onset or worsening).    dizziness 10. CAUSE: "What do you think caused the fall (or falling)?" (e.g., tripped, dizzy spell)       N/a  Answer Assessment - Initial Assessment Questions 1. BLOOD PRESSURE: "What is the blood pressure?" "Did you take at least two measurements 5 minutes apart?"     119/93 HR 95 2. ONSET: "When did you take your blood pressure?"     0745 3. HOW: "How did you take  your blood pressure?" (e.g., automatic home BP monitor, visiting nurse)     automatic 4. HISTORY: "Do you have a history of high blood pressure?"     yes 5. MEDICINES: "Are you taking any medicines for blood pressure?" "Have you missed any doses recently?"     Only missed x1 dose yesterday 6. OTHER SYMPTOMS: "Do you have any symptoms?" (e.g., blurred vision, chest pain, difficulty breathing, headache, weakness)     dizziness 7. PREGNANCY: "Is there any chance you are pregnant?" "When was your last menstrual period?"     N/a  Protocols used: Falls and Falling-A-AH, Blood Pressure - High-A-AH, Fainting-A-AH

## 2023-10-23 NOTE — Progress Notes (Signed)
 Date:  10/23/2023   Name:  Roger Ray   DOB:  1971-06-27   MRN:  [id number]   Chief Complaint: Diarrhea and Dehydration  Diarrhea  This is a new problem. The current episode started yesterday. The problem occurs 5 to 10 times per day. The problem has been unchanged. The stool consistency is described as Watery. Pertinent negatives include no abdominal pain, chills, coughing, fever or headaches. He has tried electrolyte solution and increased fluids for the symptoms.  Patient passed out at home yesterday while having diarrhea.  He fell to the floor in the bathroom and bruised his shoulder.  He was unresponsive for 5-10 min per his wife but had no post syncopal confusion or HA.  EMS came but he refused to go to ED.  His vitals were normal.,  EKG tracing viewed from EMG was unremarkable (possible old infarct but tracing was suboptimal). Today he feels slightly weak and still has loose stools.  He has been hydrating well since yesterday.  Review of Systems  Constitutional:  Positive for fatigue. Negative for chills, fever and unexpected weight change.  HENT:  Negative for trouble swallowing.   Respiratory:  Negative for cough, chest tightness, shortness of breath and wheezing.   Cardiovascular:  Negative for chest pain and palpitations.  Gastrointestinal:  Positive for diarrhea. Negative for abdominal pain.  Neurological:  Negative for dizziness, light-headedness and headaches.  Psychiatric/Behavioral:  Negative for confusion.      Lab Results  Component Value Date   NA 143 04/28/2023   K 4.2 04/28/2023   CO2 21 04/28/2023   GLUCOSE 95 04/28/2023   BUN 16 04/28/2023   CREATININE 0.81 04/28/2023   CALCIUM 10.0 04/28/2023   EGFR 107 04/28/2023   Lab Results  Component Value Date   CHOL 252 (H) 10/17/2022   HDL 54 10/17/2022   LDLCALC 162 (H) 10/17/2022   TRIG 198 (H) 10/17/2022   Lab Results  Component Value Date   TSH 1.730 04/18/2022   No results found for:  "HGBA1C" Lab Results  Component Value Date   WBC 6.9 04/18/2022   HGB 14.6 04/18/2022   HCT 43.7 04/18/2022   MCV 90 04/18/2022   PLT 261 04/18/2022   Lab Results  Component Value Date   ALT 20 10/17/2022   AST 21 10/17/2022   ALKPHOS 79 10/17/2022   BILITOT 0.4 10/17/2022   No results found for: "25OHVITD2", "25OHVITD3", "VD25OH"   Patient Active Problem List   Diagnosis Date Noted   Essential hypertension 10/17/2022   Elevated low density lipoprotein (LDL) cholesterol level 10/15/2022   Obesity 04/18/2022   Family history of diabetes mellitus 04/18/2022   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  Current Meds  Medication Sig   MULTIPLE VITAMIN PO Take 1 tablet by mouth daily.   sertraline (ZOLOFT) 100 MG tablet Take 2 tablets (200 mg total) by mouth daily.   valsartan (DIOVAN) 160 MG tablet Take 1 tablet (160 mg total) by mouth daily.  04/28/2023    3:26 PM 02/10/2023    2:52 PM 01/06/2023    3:53 PM 11/25/2022    3:39 PM  GAD 7 : Generalized Anxiety Score  Nervous, Anxious, on Edge 1 1 1 1   Control/stop worrying 1 1 1 1   Worry too much - different things 1 1 1 1   Trouble relaxing 0 1 1 1   Restless 0 0 1 0  Easily annoyed or irritable 1 1 1    Afraid - awful might happen 1 1 1  0  Total GAD 7 Score 5 6 7    Anxiety Difficulty Somewhat difficult Somewhat difficult Somewhat difficult Somewhat difficult       04/28/2023    3:26 PM 02/10/2023    2:51 PM 01/06/2023    3:52 PM  Depression screen PHQ 2/9  Decreased Interest 1 1 0  Down, Depressed, Hopeless 1 1 1    PHQ - 2 Score 2 2 1   Altered sleeping 1 1 1   Tired, decreased energy 2 1 1   Change in appetite 0 1 0  Feeling bad or failure about yourself  1 1 1   Trouble concentrating 1 1 1   Moving slowly or fidgety/restless 0 0 0  Suicidal thoughts 0 0 0  PHQ-9 Score 7 7 5   Difficult doing work/chores Somewhat difficult Somewhat difficult Somewhat difficult    BP Readings from Last 3 Encounters:  10/23/23 126/78  04/28/23 115/73  02/10/23 138/86    Physical Exam Constitutional:      Appearance: Normal appearance.  Neck:     Vascular: No carotid bruit.  Cardiovascular:     Rate and Rhythm: Normal rate and regular rhythm.     Heart sounds: No murmur heard. Pulmonary:     Effort: Pulmonary effort is normal.     Breath sounds: No wheezing or rhonchi.  Abdominal:     General: There is distension.     Palpations: Abdomen is soft. There is no mass.     Tenderness: There is no abdominal tenderness. There is no guarding or rebound.  Musculoskeletal:     Cervical back: Normal range of motion.  Lymphadenopathy:     Cervical: No cervical adenopathy.  Skin:    General: Skin is warm and dry.  Neurological:     General: No focal deficit present.     Mental Status: He is alert.     Wt Readings from Last 3 Encounters:  10/23/23 299 lb 8 oz (135.9 kg)  04/28/23 298 lb (135.2 kg)  02/10/23 (!) 306 lb 6.4 oz (139 kg)    BP 126/78   Pulse 89   Ht 5\' 9"  (1.753 m)   Wt 299 lb 8 oz (135.9 kg)   SpO2 98%   BMI 44.23 kg/m   Assessment and Plan:  Problem List Items Addressed This Visit       Unprioritized   Obstructive apnea   He is not using CPAP - per wife this is because the machine is old. uncontrolled OSA could have contributed to the syncope so I recommend he discuss this with PCP in the near future and obtain new equipment and possible repeat sleep study.      Essential hypertension   Blood pressure is well controlled.  Current medications Valsartan.       Other Visit  Diagnoses       Vasovagal syncope    -  Primary   suspect this as cause for episode; no sx to suggest seizure no evidence of head injury  he will continue hydration and follow up with PCP     Gastroenteritis       Continue to push fluids and rest may use imodium twice a day if needed will obtain labs   Relevant Orders   CBC with Differential/Platelet   Comprehensive metabolic panel with GFR       No follow-ups on file.    Reubin Milan, MD [facility] [place]

## 2023-10-23 NOTE — Assessment & Plan Note (Signed)
 He is not using CPAP - per wife this is because the machine is old. uncontrolled OSA could have contributed to the syncope so I recommend he discuss this with PCP in the near future and obtain new equipment and possible repeat sleep study.

## 2023-10-24 ENCOUNTER — Encounter: Payer: Self-pay | Admitting: Internal Medicine

## 2023-10-24 LAB — CBC WITH DIFFERENTIAL/PLATELET
Basophils Absolute: 0 10*3/uL (ref 0.0–0.2)
Basos: 0 %
EOS (ABSOLUTE): 0.1 10*3/uL (ref 0.0–0.4)
Eos: 2 %
Hematocrit: 43 % (ref 37.5–51.0)
Hemoglobin: 14.4 g/dL (ref 13.0–17.7)
Immature Grans (Abs): 0 10*3/uL (ref 0.0–0.1)
Immature Granulocytes: 0 %
Lymphocytes Absolute: 0.8 10*3/uL (ref 0.7–3.1)
Lymphs: 10 %
MCH: 30.3 pg (ref 26.6–33.0)
MCHC: 33.5 g/dL (ref 31.5–35.7)
MCV: 91 fL (ref 79–97)
Monocytes Absolute: 0.9 10*3/uL (ref 0.1–0.9)
Monocytes: 10 %
Neutrophils Absolute: 6.6 10*3/uL (ref 1.4–7.0)
Neutrophils: 78 %
Platelets: 201 10*3/uL (ref 150–450)
RBC: 4.75 x10E6/uL (ref 4.14–5.80)
RDW: 13.2 % (ref 11.6–15.4)
WBC: 8.4 10*3/uL (ref 3.4–10.8)

## 2023-10-24 LAB — COMPREHENSIVE METABOLIC PANEL WITH GFR
ALT: 17 IU/L (ref 0–44)
AST: 20 IU/L (ref 0–40)
Albumin: 4.5 g/dL (ref 3.8–4.9)
Alkaline Phosphatase: 66 IU/L (ref 44–121)
BUN/Creatinine Ratio: 15 (ref 9–20)
BUN: 16 mg/dL (ref 6–24)
Bilirubin Total: 0.7 mg/dL (ref 0.0–1.2)
CO2: 18 mmol/L — ABNORMAL LOW (ref 20–29)
Calcium: 8.9 mg/dL (ref 8.7–10.2)
Chloride: 103 mmol/L (ref 96–106)
Creatinine, Ser: 1.07 mg/dL (ref 0.76–1.27)
Globulin, Total: 2.6 g/dL (ref 1.5–4.5)
Glucose: 107 mg/dL — ABNORMAL HIGH (ref 70–99)
Potassium: 4.3 mmol/L (ref 3.5–5.2)
Sodium: 136 mmol/L (ref 134–144)
Total Protein: 7.1 g/dL (ref 6.0–8.5)
eGFR: 83 mL/min/{1.73_m2} (ref >59)

## 2023-10-27 ENCOUNTER — Ambulatory Visit: Payer: Self-pay | Admitting: Nurse Practitioner

## 2023-10-30 ENCOUNTER — Ambulatory Visit (INDEPENDENT_AMBULATORY_CARE_PROVIDER_SITE_OTHER): Payer: Self-pay | Admitting: Nurse Practitioner

## 2023-10-30 ENCOUNTER — Encounter: Payer: Self-pay | Admitting: Nurse Practitioner

## 2023-10-30 VITALS — BP 121/80 | HR 77 | Temp 98.8°F | Ht 69.0 in | Wt 293.2 lb

## 2023-10-30 DIAGNOSIS — F3341 Major depressive disorder, recurrent, in partial remission: Secondary | ICD-10-CM

## 2023-10-30 DIAGNOSIS — Z6841 Body Mass Index (BMI) 40.0 and over, adult: Secondary | ICD-10-CM | POA: Diagnosis not present

## 2023-10-30 DIAGNOSIS — G4733 Obstructive sleep apnea (adult) (pediatric): Secondary | ICD-10-CM | POA: Diagnosis not present

## 2023-10-30 DIAGNOSIS — I10 Essential (primary) hypertension: Secondary | ICD-10-CM | POA: Diagnosis not present

## 2023-10-30 DIAGNOSIS — E7800 Pure hypercholesterolemia, unspecified: Secondary | ICD-10-CM | POA: Diagnosis not present

## 2023-10-30 DIAGNOSIS — E66813 Obesity, class 3: Secondary | ICD-10-CM | POA: Diagnosis not present

## 2023-10-30 NOTE — Assessment & Plan Note (Signed)
 BMI 43.40 with 6 lbs loss, praised for this.  Recommended eating smaller high protein, low fat meals more frequently and exercising 30 mins a day 5 times a week with a goal of 10-15lb weight loss in the next 3 months. Patient voiced their understanding and motivation to adhere to these recommendations.

## 2023-10-30 NOTE — Assessment & Plan Note (Signed)
 Noted on past labs, working on diet.  Check lipid panel and continue diet focus.

## 2023-10-30 NOTE — Progress Notes (Signed)
 BP 121/80   Pulse 77   Temp 98.8 F (37.1 C) (Oral)   Ht 5\' 9"  (1.753 m)   Wt 293 lb 3.2 oz (133 kg)   SpO2 98%   BMI 43.30 kg/m    Subjective:    Patient ID: Roger Ray, male    DOB: October 24, 1970, 53 y.o.   MRN: [id number]  HPI: Garvey Westcott is a 53 y.o. male  Chief Complaint  Patient presents with   Depression   Hyperlipidemia   Hypertension   Sleep Apnea   HYPERTENSION / HYPERLIPIDEMIA Taking Valsartan 160 MG daily.  Has changed diet at home.  Recently had a vasovagal episode and was seen by EMS, refused ER.  Not using CPAP machine, last sleep study many years ago.  Equipment is old. Satisfied with current treatment? yes Duration of hypertension: chronic BP monitoring frequency: daily BP range: 92/63 to 130/97 -- on average less then 130/80 BP medication side effects: no Duration of hyperlipidemia: chronic Cholesterol supplements: none Aspirin: no Recent stressors: yes Recurrent headaches: no Visual changes: no Palpitations: no Dyspnea: no Chest pain: no Lower extremity edema: no Dizzy/lightheaded: no  The 10-year ASCVD risk score (Arnett DK, et al., 2019) is: 5.6%   Values used to calculate the score:     Age: 10 years     Sex: Male     Is Non-Hispanic African American: No     Diabetic: No     Tobacco smoker: No     Systolic Blood Pressure: 121 mmHg     Is BP treated: Yes     HDL Cholesterol: 54 mg/dL     Total Cholesterol: 252 mg/dL  DEPRESSION Continues to take Sertraline 100 MG daily. Has more stressors recently, father has dementia -- he lives on own, has lady that comes by twice a day.  One day he called him 26 times in a day.  Has brother that lives out of state.   Mood status: exacerbated Satisfied with current treatment?: yes Symptom severity: moderate  Duration of current treatment : chronic Side effects: no Medication compliance: good compliance Psychotherapy/counseling: no Depressed mood:  a little bit Anxious mood: yes Anhedonia:  yes Significant weight loss or gain: no Insomnia: a little at times Fatigue: occasional Feelings of worthlessness or guilt: no Impaired concentration/indecisiveness: no Suicidal ideations: no Hopelessness: no Crying spells: no    10/30/2023    2:44 PM 04/28/2023    3:26 PM 02/10/2023    2:51 PM 01/06/2023    3:52 PM 11/25/2022    3:39 PM  Depression screen PHQ 2/9  Decreased Interest 1 1 1  0 1  Down, Depressed, Hopeless 1 1 1 1 1   PHQ - 2 Score 2 2 2 1 2   Altered sleeping 2 1 1 1 2   Tired, decreased energy 2 2 1 1 1   Change in appetite 1 0 1 0 0  Feeling bad or failure about yourself  1 1 1 1 1   Trouble concentrating 1 1 1 1  0  Moving slowly or fidgety/restless 1 0 0 0 0  Suicidal thoughts 0 0 0 0 0  PHQ-9 Score 10 7 7 5 6   Difficult doing work/chores Somewhat difficult Somewhat difficult Somewhat difficult Somewhat difficult Somewhat difficult       10/30/2023    2:45 PM 04/28/2023    3:26 PM 02/10/2023    2:52 PM 01/06/2023    3:53 PM  GAD 7 : Generalized Anxiety Score  Nervous, Anxious, on Edge 2 1  1 1  Control/stop worrying 2 1 1 1   Worry too much - different things 2 1 1 1   Trouble relaxing 2 0 1 1  Restless 1 0 0 1  Easily annoyed or irritable 1 1 1 1   Afraid - awful might happen 1 1 1 1   Total GAD 7 Score 11 5 6 7   Anxiety Difficulty Somewhat difficult Somewhat difficult Somewhat difficult Somewhat difficult   Relevant past medical, surgical, family and social history reviewed and updated as indicated. Interim medical history since our last visit reviewed. Allergies and medications reviewed and updated.  Review of Systems  Constitutional:  Negative for activity change, diaphoresis, fatigue and fever.  Respiratory:  Negative for cough, chest tightness, shortness of breath and wheezing.   Cardiovascular:  Negative for chest pain, palpitations and leg swelling.  Gastrointestinal: Negative.   Endocrine: Negative.   Neurological: Negative.   Psychiatric/Behavioral:  Negative.     Per HPI unless specifically indicated above     Objective:    BP 121/80   Pulse 77   Temp 98.8 F (37.1 C) (Oral)   Ht 5\' 9"  (1.753 m)   Wt 293 lb 3.2 oz (133 kg)   SpO2 98%   BMI 43.30 kg/m   Wt Readings from Last 3 Encounters:  10/30/23 293 lb 3.2 oz (133 kg)  10/23/23 299 lb 8 oz (135.9 kg)  04/28/23 298 lb (135.2 kg)    Physical Exam Vitals and nursing note reviewed.  Constitutional:      General: He is awake. He is not in acute distress.    Appearance: He is well-developed and well-groomed. He is obese. He is not ill-appearing or toxic-appearing.  HENT:     Head: Normocephalic.     Right Ear: Hearing and external ear normal.     Left Ear: Hearing and external ear normal.  Eyes:     General: Lids are normal.     Extraocular Movements: Extraocular movements intact.     Conjunctiva/sclera: Conjunctivae normal.  Neck:     Thyroid: No thyromegaly.     Vascular: No carotid bruit.  Cardiovascular:     Rate and Rhythm: Normal rate and regular rhythm.     Heart sounds: Normal heart sounds. No murmur heard.    No gallop.  Pulmonary:     Effort: No accessory muscle usage or respiratory distress.     Breath sounds: Normal breath sounds.  Abdominal:     General: Bowel sounds are normal. There is no distension.     Palpations: Abdomen is soft.     Tenderness: There is no abdominal tenderness.  Musculoskeletal:     Cervical back: Full passive range of motion without pain.     Right lower leg: No edema.     Left lower leg: No edema.  Lymphadenopathy:     Cervical: No cervical adenopathy.  Skin:    General: Skin is warm.     Capillary Refill: Capillary refill takes less than 2 seconds.  Neurological:     Mental Status: He is alert and oriented to person, place, and time.     Deep Tendon Reflexes: Reflexes are normal and symmetric.     Reflex Scores:      Brachioradialis reflexes are 2+ on the right side and 2+ on the left side.      Patellar reflexes  are 2+ on the right side and 2+ on the left side. Psychiatric:        Attention and Perception:  Attention normal.        Mood and Affect: Mood normal.        Speech: Speech normal.        Behavior: Behavior normal. Behavior is cooperative.        Thought Content: Thought content normal.    Results for orders placed or performed in visit on 10/23/23  CBC with Differential/Platelet   Collection Time: 10/23/23 10:03 AM  Result Value Ref Range   WBC 8.4 3.4 - 10.8 x10E3/uL   RBC 4.75 4.14 - 5.80 x10E6/uL   Hemoglobin 14.4 13.0 - 17.7 g/dL   Hematocrit 43.3 29.5 - 51.0 %   MCV 91 79 - 97 fL   MCH 30.3 26.6 - 33.0 pg   MCHC 33.5 31.5 - 35.7 g/dL   RDW 18.8 41.6 - 60.6 %   Platelets 201 150 - 450 x10E3/uL   Neutrophils 78 Not Estab. %   Lymphs 10 Not Estab. %   Monocytes 10 Not Estab. %   Eos 2 Not Estab. %   Basos 0 Not Estab. %   Neutrophils Absolute 6.6 1.4 - 7.0 x10E3/uL   Lymphocytes Absolute 0.8 0.7 - 3.1 x10E3/uL   Monocytes Absolute 0.9 0.1 - 0.9 x10E3/uL   EOS (ABSOLUTE) 0.1 0.0 - 0.4 x10E3/uL   Basophils Absolute 0.0 0.0 - 0.2 x10E3/uL   Immature Granulocytes 0 Not Estab. %   Immature Grans (Abs) 0.0 0.0 - 0.1 x10E3/uL  Comprehensive metabolic panel with GFR   Collection Time: 10/23/23 10:03 AM  Result Value Ref Range   Glucose 107 (H) 70 - 99 mg/dL   BUN 16 6 - 24 mg/dL   Creatinine, Ser 3.01 0.76 - 1.27 mg/dL   eGFR 83 >60 FU/XNA/3.55   BUN/Creatinine Ratio 15 9 - 20   Sodium 136 134 - 144 mmol/L   Potassium 4.3 3.5 - 5.2 mmol/L   Chloride 103 96 - 106 mmol/L   CO2 18 (L) 20 - 29 mmol/L   Calcium 8.9 8.7 - 10.2 mg/dL   Total Protein 7.1 6.0 - 8.5 g/dL   Albumin 4.5 3.8 - 4.9 g/dL   Globulin, Total 2.6 1.5 - 4.5 g/dL   Bilirubin Total 0.7 0.0 - 1.2 mg/dL   Alkaline Phosphatase 66 44 - 121 IU/L   AST 20 0 - 40 IU/L   ALT 17 0 - 44 IU/L      Assessment & Plan:   Problem List Items Addressed This Visit       Cardiovascular and Mediastinum   Essential  hypertension   Ongoing with improvement, occasional lows.  Will continue Valsartan 160 MG daily, educated him on this and side effects + use of medication.  Advised them if consistent lows in 90/50 range then cut Valsartan in 1/2 and alert provider.  New sleep study referral placed.  Cannot afford at this time.  Recommend check BP 3 days a week and document levels for provider.  Focus heavily on DASH diet and regular activity.  LABS: CMP today.       Relevant Orders   Comprehensive metabolic panel with GFR     Respiratory   Obstructive apnea   Ongoing with current non-use of CPAP due to issues with equipment.  Highly recommend new sleep study and use of CPAP, referral placed today.  Educated on risks of non-use over time to health, which we are seeing today with elevation in BP readings.        Relevant Orders   Ambulatory referral  to Sleep Studies     Other   Clinical depression - Primary   Chronic, ongoing for many years.  Denies SI/HI.  At this time wishes to maintain current medication regimen, but discussed with him adding on Wellbutrin in future for mood -- which has less sex drive and weight gain side effects, but may increase motivation and mood.  Refills up to date.      Elevated low density lipoprotein (LDL) cholesterol level   Noted on past labs, working on diet.  Check lipid panel and continue diet focus.      Relevant Orders   Comprehensive metabolic panel with GFR   Lipid Panel w/o Chol/HDL Ratio   Obesity   BMI 16.10 with 6 lbs loss, praised for this.  Recommended eating smaller high protein, low fat meals more frequently and exercising 30 mins a day 5 times a week with a goal of 10-15lb weight loss in the next 3 months. Patient voiced their understanding and motivation to adhere to these recommendations.          Follow up plan: Return in about 6 months (around 04/30/2024) for Annual Physical.

## 2023-10-30 NOTE — Assessment & Plan Note (Signed)
 Ongoing with current non-use of CPAP due to issues with equipment.  Highly recommend new sleep study and use of CPAP, referral placed today.  Educated on risks of non-use over time to health, which we are seeing today with elevation in BP readings.

## 2023-10-30 NOTE — Assessment & Plan Note (Signed)
 Ongoing with improvement, occasional lows.  Will continue Valsartan 160 MG daily, educated him on this and side effects + use of medication.  Advised them if consistent lows in 90/50 range then cut Valsartan in 1/2 and alert provider.  New sleep study referral placed.  Cannot afford at this time.  Recommend check BP 3 days a week and document levels for provider.  Focus heavily on DASH diet and regular activity.  LABS: CMP today.

## 2023-10-30 NOTE — Assessment & Plan Note (Signed)
Chronic, ongoing for many years.  Denies SI/HI.  At this time wishes to maintain current medication regimen, but discussed with him adding on Wellbutrin in future for mood -- which has less sex drive and weight gain side effects, but may increase motivation and mood.  Refills up to date.

## 2023-10-31 ENCOUNTER — Encounter: Payer: Self-pay | Admitting: Nurse Practitioner

## 2023-10-31 LAB — COMPREHENSIVE METABOLIC PANEL WITH GFR
ALT: 22 IU/L (ref 0–44)
AST: 25 IU/L (ref 0–40)
Albumin: 4.5 g/dL (ref 3.8–4.9)
Alkaline Phosphatase: 62 IU/L (ref 44–121)
BUN/Creatinine Ratio: 19 (ref 9–20)
BUN: 16 mg/dL (ref 6–24)
Bilirubin Total: 0.3 mg/dL (ref 0.0–1.2)
CO2: 20 mmol/L (ref 20–29)
Calcium: 9.4 mg/dL (ref 8.7–10.2)
Chloride: 104 mmol/L (ref 96–106)
Creatinine, Ser: 0.85 mg/dL (ref 0.76–1.27)
Globulin, Total: 2.7 g/dL (ref 1.5–4.5)
Glucose: 82 mg/dL (ref 70–99)
Potassium: 4.2 mmol/L (ref 3.5–5.2)
Sodium: 140 mmol/L (ref 134–144)
Total Protein: 7.2 g/dL (ref 6.0–8.5)
eGFR: 105 mL/min/{1.73_m2} (ref >59)

## 2023-10-31 LAB — LIPID PANEL W/O CHOL/HDL RATIO
Cholesterol, Total: 187 mg/dL (ref 100–199)
HDL: 36 mg/dL — ABNORMAL LOW (ref >39)
LDL Chol Calc (NIH): 134 mg/dL — ABNORMAL HIGH (ref 0–99)
Triglycerides: 91 mg/dL (ref 0–149)
VLDL Cholesterol Cal: 17 mg/dL (ref 5–40)

## 2023-10-31 NOTE — Progress Notes (Signed)
 Contacted via MyChart The 10-year ASCVD risk score (Arnett DK, et al., 2019) is: 5.6%   Values used to calculate the score:     Age: 53 years     Sex: Male     Is Non-Hispanic African American: No     Diabetic: No     Tobacco smoker: No     Systolic Blood Pressure: 121 mmHg     Is BP treated: Yes     HDL Cholesterol: 36 mg/dL     Total Cholesterol: 187 mg/dL   Good morning Roger Ray, your labs have returned: - LDL on lipid panel remains elevated but your total cholesterol has improved and the LDL has come down from past labs.  Continue focus on healthy diet and regular exercise. - Kidney function, creatinine and eGFR, remains normal, as is liver function, AST and ALT.  Any questions? Keep being stellar!!  Thank you for allowing me to participate in your care.  I appreciate you. Kindest regards, Shuntae Herzig

## 2024-04-27 NOTE — Patient Instructions (Signed)
 Be Involved in Caring For Your Health:  Taking Medications When medications are taken as directed, they can greatly improve your health. But if they are not taken as prescribed, they may not work. In some cases, not taking them correctly can be harmful. To help ensure your treatment remains effective and safe, understand your medications and how to take them. Bring your medications to each visit for review by your provider.  Your lab results, notes, and after visit summary will be available on My Chart. We strongly encourage you to use this feature. If lab results are abnormal the clinic will contact you with the appropriate steps. If the clinic does not contact you assume the results are satisfactory. You can always view your results on My Chart. If you have questions regarding your health or results, please contact the clinic during office hours. You can also ask questions on My Chart.  We at [facility] are grateful that you chose us  to provide your care. We strive to provide evidence-based and compassionate care and are always looking for feedback. If you get a survey from the clinic please complete this so we can hear your opinions.  DASH Eating Plan DASH stands for Dietary Approaches to Stop Hypertension. The DASH eating plan is a healthy eating plan that has been shown to: Lower high blood pressure (hypertension). Reduce your risk for type 2 diabetes, heart disease, and stroke. Help with weight loss. What are tips for following this plan? Reading food labels Check food labels for the amount of salt (sodium) per serving. Choose foods with less than 5 percent of the Daily Value (DV) of sodium. In general, foods with less than 300 milligrams (mg) of sodium per serving fit into this eating plan. To find whole grains, look for the word whole as the first word in the ingredient list. Shopping Buy products labeled as low-sodium or no salt added. Buy fresh foods. Avoid canned  foods and pre-made or frozen meals. Cooking Try not to add salt when you cook. Use salt-free seasonings or herbs instead of table salt or sea salt. Check with your health care provider or pharmacist before using salt substitutes. Do not fry foods. Cook foods in healthy ways, such as baking, boiling, grilling, roasting, or broiling. Cook using oils that are good for your heart. These include olive, canola, avocado, soybean, and sunflower oil. Meal planning  Eat a balanced diet. This should include: 4 or more servings of fruits and 4 or more servings of vegetables each day. Try to fill half of your plate with fruits and vegetables. 6-8 servings of whole grains each day. 6 or less servings of lean meat, poultry, or fish each day. 1 oz is 1 serving. A 3 oz (85 g) serving of meat is about the same size as the palm of your hand. One egg is 1 oz (28 g). 2-3 servings of low-fat dairy each day. One serving is 1 cup (237 mL). 1 serving of nuts, seeds, or beans 5 times each week. 2-3 servings of heart-healthy fats. Healthy fats called omega-3 fatty acids are found in foods such as walnuts, flaxseeds, fortified milks, and eggs. These fats are also found in cold-water fish, such as sardines, salmon, and mackerel. Limit how much you eat of: Canned or prepackaged foods. Food that is high in trans fat, such as fried foods. Food that is high in saturated fat, such as fatty meat. Desserts and other sweets, sugary drinks, and other foods with added sugar. Full-fat  dairy products. Do not salt foods before eating. Do not eat more than 4 egg yolks a week. Try to eat at least 2 vegetarian meals a week. Eat more home-cooked food and less restaurant, buffet, and fast food. Lifestyle When eating at a restaurant, ask if your food can be made with less salt or no salt. If you drink alcohol: Limit how much you have to: 0-1 drink a day if you are male. 0-2 drinks a day if you are male. Know how much alcohol is in  your drink. In the [place] one drink is one 12 oz bottle of beer (355 mL), one 5 oz glass of wine (148 mL), or one 1 oz glass of hard liquor (44 mL). General information Avoid eating more than 2,300 mg of salt a day. If you have hypertension, you may need to reduce your sodium intake to 1,500 mg a day. Work with your provider to stay at a healthy body weight or lose weight. Ask what the best weight range is for you. On most days of the week, get at least 30 minutes of exercise that causes your heart to beat faster. This may include walking, swimming, or biking. Work with your provider or dietitian to adjust your eating plan to meet your specific calorie needs. What foods should I eat? Fruits All fresh, dried, or frozen fruit. Canned fruits that are in their natural juice and do not have sugar added to them. Vegetables Fresh or frozen vegetables that are raw, steamed, roasted, or grilled. Low-sodium or reduced-sodium tomato and vegetable juice. Low-sodium or reduced-sodium tomato sauce and tomato paste. Low-sodium or reduced-sodium canned vegetables. Grains Whole-grain or whole-wheat bread. Whole-grain or whole-wheat pasta. Brown rice. [place] Madeira. Bulgur. Whole-grain and low-sodium cereals. Pita bread. Low-fat, low-sodium crackers. Whole-wheat flour tortillas. Meats and other proteins Skinless chicken or [place]. Ground chicken or [place]. Pork with fat trimmed off. Fish and seafood. Egg whites. Dried beans, peas, or lentils. Unsalted nuts, nut butters, and seeds. Unsalted canned beans. Lean cuts of beef with fat trimmed off. Low-sodium, lean precooked or cured meat, such as sausages or meat loaves. Dairy Low-fat (1%) or fat-free (skim) milk. Reduced-fat, low-fat, or fat-free cheeses. Nonfat, low-sodium ricotta or cottage cheese. Low-fat or nonfat yogurt. Low-fat, low-sodium cheese. Fats and oils Soft margarine without trans fats. Vegetable oil. Reduced-fat, low-fat, or light mayonnaise and salad  dressings (reduced-sodium). Canola, safflower, olive, avocado, soybean, and sunflower oils. Avocado. Seasonings and condiments Herbs. Spices. Seasoning mixes without salt. Other foods Unsalted popcorn and pretzels. Fat-free sweets. The items listed above may not be all the foods and drinks you can have. Talk to a dietitian to learn more. What foods should I avoid? Fruits Canned fruit in a light or heavy syrup. Fried fruit. Fruit in cream or butter sauce. Vegetables Creamed or fried vegetables. Vegetables in a cheese sauce. Regular canned vegetables that are not marked as low-sodium or reduced-sodium. Regular canned tomato sauce and paste that are not marked as low-sodium or reduced-sodium. Regular tomato and vegetable juices that are not marked as low-sodium or reduced-sodium. Dene. Olives. Grains Baked goods made with fat, such as croissants, muffins, or some breads. Dry pasta or rice meal packs. Meats and other proteins Fatty cuts of meat. Ribs. Fried meat. [place]. Bologna, salami, and other precooked or cured meats, such as sausages or meat loaves, that are not lean and low in sodium. Fat from the back of a pig (fatback). Bratwurst. Salted nuts and seeds. Canned beans with added salt. Canned  or smoked fish. Whole eggs or egg yolks. Chicken or [place] with skin. Dairy Whole or 2% milk, cream, and half-and-half. Whole or full-fat cream cheese. Whole-fat or sweetened yogurt. Full-fat cheese. Nondairy creamers. Whipped toppings. Processed cheese and cheese spreads. Fats and oils Butter. Stick margarine. Lard. Shortening. Ghee. Bacon fat. Tropical oils, such as coconut, palm kernel, or palm oil. Seasonings and condiments Onion salt, garlic salt, seasoned salt, table salt, and sea salt. Worcestershire sauce. Tartar sauce. Barbecue sauce. Teriyaki sauce. Soy sauce, including reduced-sodium soy sauce. Steak sauce. Canned and packaged gravies. Fish sauce. Oyster sauce. Cocktail sauce. Store-bought  horseradish. Ketchup. Mustard. Meat flavorings and tenderizers. Bouillon cubes. Hot sauces. Pre-made or packaged marinades. Pre-made or packaged taco seasonings. Relishes. Regular salad dressings. Other foods Salted popcorn and pretzels. The items listed above may not be all the foods and drinks you should avoid. Talk to a dietitian to learn more. Where to find more information National Heart, Lung, and Blood Institute (NHLBI): BuffaloDryCleaner.gl American Heart Association (AHA): heart.org Academy of Nutrition and Dietetics: eatright.org National Kidney Foundation (NKF): kidney.org This information is not intended to replace advice given to you by your health care provider. Make sure you discuss any questions you have with your health care provider. Document Revised: 07/28/2022 Document Reviewed: 07/28/2022 Elsevier Patient Education  2024 [employer].

## 2024-04-30 ENCOUNTER — Other Ambulatory Visit: Payer: Self-pay | Admitting: Nurse Practitioner

## 2024-05-02 NOTE — Telephone Encounter (Signed)
 Requested Prescriptions  Pending Prescriptions Disp Refills   sertraline  (ZOLOFT ) 100 MG tablet [Pharmacy Med Name: SERTRALINE  100 MG TAB[*]] 180 tablet 4    Sig: TAKE TWO TABLETS BY MOUTH ONE TIME DAILY     Psychiatry:  Antidepressants - SSRI - sertraline  Failed - 05/02/2024  1:41 PM      Failed - Valid encounter within last 6 months    Recent Outpatient Visits           6 months ago Recurrent major depressive disorder, in partial remission   [facility], Melanie T, NP   6 months ago Vasovagal syncope   [facility], Leita DEL, MD              Passed - AST in normal range and within 360 days    AST  Date Value Ref Range Status  10/30/2023 25 0 - 40 IU/L Final         Passed - ALT in normal range and within 360 days    ALT  Date Value Ref Range Status  10/30/2023 22 0 - 44 IU/L Final         Passed - Completed PHQ-2 or PHQ-9 in the last 360 days

## 2024-05-03 ENCOUNTER — Encounter: Payer: Self-pay | Admitting: Nurse Practitioner

## 2024-05-03 ENCOUNTER — Ambulatory Visit (INDEPENDENT_AMBULATORY_CARE_PROVIDER_SITE_OTHER): Admitting: Nurse Practitioner

## 2024-05-03 VITALS — BP 138/92 | HR 77 | Temp 97.7°F | Ht 68.5 in | Wt 290.2 lb

## 2024-05-03 DIAGNOSIS — E7800 Pure hypercholesterolemia, unspecified: Secondary | ICD-10-CM

## 2024-05-03 DIAGNOSIS — E66813 Obesity, class 3: Secondary | ICD-10-CM | POA: Diagnosis not present

## 2024-05-03 DIAGNOSIS — Z6841 Body Mass Index (BMI) 40.0 and over, adult: Secondary | ICD-10-CM | POA: Diagnosis not present

## 2024-05-03 DIAGNOSIS — Z114 Encounter for screening for human immunodeficiency virus [HIV]: Secondary | ICD-10-CM

## 2024-05-03 DIAGNOSIS — Z0000 Encounter for general adult medical examination without abnormal findings: Secondary | ICD-10-CM | POA: Diagnosis not present

## 2024-05-03 DIAGNOSIS — N400 Enlarged prostate without lower urinary tract symptoms: Secondary | ICD-10-CM | POA: Diagnosis not present

## 2024-05-03 DIAGNOSIS — F3341 Major depressive disorder, recurrent, in partial remission: Secondary | ICD-10-CM

## 2024-05-03 DIAGNOSIS — G4733 Obstructive sleep apnea (adult) (pediatric): Secondary | ICD-10-CM

## 2024-05-03 DIAGNOSIS — I10 Essential (primary) hypertension: Secondary | ICD-10-CM

## 2024-05-03 DIAGNOSIS — Z1159 Encounter for screening for other viral diseases: Secondary | ICD-10-CM

## 2024-05-03 DIAGNOSIS — Z833 Family history of diabetes mellitus: Secondary | ICD-10-CM

## 2024-05-03 LAB — MICROALBUMIN, URINE WAIVED
Creatinine, Urine Waived: 300 mg/dL (ref 10–300)
Microalb, Ur Waived: 30 mg/L — ABNORMAL HIGH (ref 0–19)
Microalb/Creat Ratio: <30 mg/g (ref <30)

## 2024-05-03 LAB — BAYER DCA HB A1C WAIVED: HB A1C (BAYER DCA - WAIVED): 5.4 % (ref 4.8–5.6)

## 2024-05-03 MED ORDER — VALSARTAN 320 MG PO TABS: 320.0000 mg | ORAL_TABLET | Freq: Every day | ORAL | 3 refills | Status: AC

## 2024-05-03 MED ORDER — SERTRALINE HCL 100 MG PO TABS: 100.0000 mg | ORAL_TABLET | Freq: Every day | ORAL | 3 refills | Status: DC

## 2024-05-03 NOTE — Assessment & Plan Note (Signed)
 BMI 43.48.  Recommended eating smaller high protein, low fat meals more frequently and exercising 30 mins a day 5 times a week with a goal of 10-15lb weight loss in the next 3 months. Patient voiced their understanding and motivation to adhere to these recommendations.

## 2024-05-03 NOTE — Assessment & Plan Note (Signed)
 In mother.  Will check A1c on patient annually, today is 5.4%.

## 2024-05-03 NOTE — Assessment & Plan Note (Signed)
 Ongoing with current non-use of CPAP due to issues with equipment.  Highly recommend new sleep study and use of CPAP, referral placed last visit.  Did not schedule.  Educated on risks of non-use over time to health, which we are seeing today with elevation in BP readings.

## 2024-05-03 NOTE — Assessment & Plan Note (Signed)
 Noted on past labs, working on diet. Check lipid panel and continue diet focus. Discussed with him that he is right on the cusp of where medication is recommended, may need to start statin in future. The 10-year ASCVD risk score (Arnett DK, et al., 2019) is: 7.1%   Values used to calculate the score:     Age: 53 years     Clincally relevant sex: Male     Is Non-Hispanic African American: No     Diabetic: No     Tobacco smoker: No     Systolic Blood Pressure: 138 mmHg     Is BP treated: Yes     HDL Cholesterol: 36 mg/dL     Total Cholesterol: 187 mg/dL

## 2024-05-03 NOTE — Assessment & Plan Note (Signed)
 Chronic with some elevation on checks today and at home.  Will increase Valsartan  to 320 MG daily, educated him on this and side effects + use of medication.  Advised them if consistent lows in 90/50 range then cut Valsartan  in 1/2 and alert provider.  New sleep study referral placed last visit, has not performed.  Cannot afford at this time.  Recommend check BP 3 days a week and document levels for provider.  Focus heavily on DASH diet and regular activity.  LABS: CBC, CMP, TSH, urine ALB.  Urine ALB 23 May 2024.

## 2024-05-03 NOTE — Assessment & Plan Note (Signed)
Chronic, ongoing for many years.  Denies SI/HI.  At this time wishes to maintain current medication regimen, but discussed with him adding on Wellbutrin in future for mood -- which has less sex drive and weight gain side effects, but may increase motivation and mood.  Refills up to date.

## 2024-05-03 NOTE — Progress Notes (Signed)
 BP (!) 138/92 (BP Location: Left Arm, Patient Position: Sitting, Cuff Size: Large)   Pulse 77   Temp 97.7 F (36.5 C) (Oral)   Ht 5' 8.5 (1.74 m)   Wt 290 lb 3.2 oz (131.6 kg)   SpO2 97%   BMI 43.48 kg/m    Subjective:    Patient ID: Roger Ray, male    DOB: Feb 16, 1971, 53 y.o.   MRN: [id number]  HPI: Roger Ray is a 53 y.o. male presenting on 05/03/2024 for comprehensive medical examination. Current medical complaints include:none  He currently lives with: wife Interim Problems from his last visit: no  HYPERTENSION without Chronic Kidney Disease Continues Valsartan  160 MG daily. Not using CPAP machine, last sleep study many years ago. Equipment is old.  Diet has been a little iffy. Hypertension status: uncontrolled  Satisfied with current treatment? yes Duration of hypertension: chronic BP monitoring frequency:  a few times a week BP range: 140/80 range BP medication side effects:  no Medication compliance: good compliance Aspirin: no Recurrent headaches: no Visual changes: no Palpitations: no Dyspnea: no Chest pain: no Lower extremity edema: no Dizzy/lightheaded: no  The 10-year ASCVD risk score (Arnett DK, et al., 2019) is: 7.1%   Values used to calculate the score:     Age: 37 years     Clincally relevant sex: Male     Is Non-Hispanic African American: No     Diabetic: No     Tobacco smoker: No     Systolic Blood Pressure: 138 mmHg     Is BP treated: Yes     HDL Cholesterol: 36 mg/dL     Total Cholesterol: 187 mg/dL  Impaired Fasting Glucose Has family history of diabetes. Duration of elevated blood sugar:  Polydipsia: no Polyuria: no Weight change: no Visual disturbance: no Glucose Monitoring: no    Accucheck frequency: Not Checking    Fasting glucose:     Post prandial:  Diabetic Education: Not Completed Family history of diabetes: yes   DEPRESSION/ANXIETY Takes Sertraline  daily. Has been out of medication for short period. Mood  status: stable Satisfied with current treatment?: yes Symptom severity: mild  Duration of current treatment : chronic Side effects: no Medication compliance: good compliance Depressed mood: a little bit Anxious mood: a little bit Anhedonia: no Significant weight loss or gain: no Insomnia: yes occasional Fatigue: no Feelings of worthlessness or guilt: no Impaired concentration/indecisiveness: no Suicidal ideations: no Hopelessness: no Crying spells: no    05/03/2024    2:01 PM 10/30/2023    2:44 PM 04/28/2023    3:26 PM 02/10/2023    2:51 PM 01/06/2023    3:52 PM  Depression screen PHQ 2/9  Decreased Interest 1 1 1 1  0  Down, Depressed, Hopeless 1 1 1 1 1   PHQ - 2 Score 2 2 2 2 1   Altered sleeping 1 2 1 1 1   Tired, decreased energy 1 2 2 1 1   Change in appetite 1 1 0 1 0  Feeling bad or failure about yourself  1 1 1 1 1   Trouble concentrating 1 1 1 1 1   Moving slowly or fidgety/restless 0 1 0 0 0  Suicidal thoughts 0 0 0 0 0  PHQ-9 Score 7 10 7 7 5   Difficult doing work/chores Somewhat difficult Somewhat difficult Somewhat difficult Somewhat difficult Somewhat difficult      05/03/2024    2:01 PM 10/30/2023    2:45 PM 04/28/2023    3:26 PM 02/10/2023  2:52 PM  GAD 7 : Generalized Anxiety Score  Nervous, Anxious, on Edge 1 2 1 1   Control/stop worrying 1 2 1 1   Worry too much - different things 1 2 1 1   Trouble relaxing 1 2 0 1  Restless 1 1 0 0  Easily annoyed or irritable 1 1 1 1   Afraid - awful might happen 1 1 1 1   Total GAD 7 Score 7 11 5 6   Anxiety Difficulty Somewhat difficult Somewhat difficult Somewhat difficult Somewhat difficult      11/25/2022    3:38 PM 02/10/2023    2:51 PM 10/23/2023    9:09 AM 10/30/2023    2:42 PM 05/03/2024    2:01 PM  Fall Risk  Falls in the past year? 0 0 0 1 0  Was there an injury with Fall? 0 0 0 0 0  Fall Risk Category Calculator 0 0 0 1 0  Patient at Risk for Falls Due to No Fall Risks No Fall Risks No Fall Risks No Fall Risks  No Fall Risks  Fall risk Follow up Falls evaluation completed Falls evaluation completed Falls evaluation completed Falls evaluation completed Falls evaluation completed    Functional Status Survey: Is the patient deaf or have difficulty hearing?: No Does the patient have difficulty seeing, even when wearing glasses/contacts?: No Does the patient have difficulty concentrating, remembering, or making decisions?: No Does the patient have difficulty walking or climbing stairs?: No Does the patient have difficulty dressing or bathing?: No Does the patient have difficulty doing errands alone such as visiting a doctor's office or shopping?: No    Past Medical History:  Past Medical History:  Diagnosis Date   Anxiety    Depression     Surgical History:  Past Surgical History:  Procedure Laterality Date   NO PAST SURGERIES      Medications:  Current Outpatient Medications on File Prior to Visit  Medication Sig   MULTIPLE VITAMIN PO Take 1 tablet by mouth daily.   No current facility-administered medications on file prior to visit.    Allergies:  No Known Allergies  Social History:  Social History   Socioeconomic History   Marital status: Married    Spouse name: Not on file   Number of children: 1   Years of education: Not on file   Highest education level: Not on file  Occupational History   Not on file  Tobacco Use   Smoking status: Never   Smokeless tobacco: Never  Vaping Use   Vaping status: Never Used  Substance and Sexual Activity   Alcohol use: Never   Drug use: Never   Sexual activity: Not Currently  Other Topics Concern   Not on file  Social History Narrative   Not on file   Social Drivers of Health   Financial Resource Strain: Low Risk  (05/03/2024)   Overall Financial Resource Strain (CARDIA)    Difficulty of Paying Living Expenses: Not hard at all  Food Insecurity: No Food Insecurity (05/03/2024)   Hunger Vital Sign    Worried About Running Out  of Food in the Last Year: Never true    Ran Out of Food in the Last Year: Never true  Transportation Needs: No Transportation Needs (05/03/2024)   PRAPARE - Administrator, Civil Service (Medical): No    Lack of Transportation (Non-Medical): No  Physical Activity: Inactive (05/03/2024)   Exercise Vital Sign    Days of Exercise per Week: 0 days  Minutes of Exercise per Session: 0 min  Stress: Stress Concern Present (05/03/2024)   [employer] of Occupational Health - Occupational Stress Questionnaire    Feeling of Stress: To some extent  Social Connections: Moderately Integrated (05/03/2024)   Social Connection and Isolation Panel    Frequency of Communication with Friends and Family: More than three times a week    Frequency of Social Gatherings with Friends and Family: More than three times a week    Attends Religious Services: More than 4 times per year    Active Member of [employer] or Organizations: No    Attends Banker Meetings: Never    Marital Status: Married  Catering manager Violence: Not At Risk (05/03/2024)   Humiliation, Afraid, Rape, and Kick questionnaire    Fear of Current or Ex-Partner: No    Emotionally Abused: No    Physically Abused: No    Sexually Abused: No   Social History   Tobacco Use  Smoking Status Never  Smokeless Tobacco Never   Social History   Substance and Sexual Activity  Alcohol Use Never    Family History:  Family History  Problem Relation Age of Onset   Diabetes Mother    Stroke Father    Healthy Brother    Autism spectrum disorder Son    Stroke Paternal Grandfather     Past medical history, surgical history, medications, allergies, family history and social history reviewed with patient today and changes made to appropriate areas of the chart.   ROS All other ROS negative except what is listed above and in the HPI.      Objective:    BP (!) 138/92 (BP Location: Left Arm, Patient Position: Sitting,  Cuff Size: Large)   Pulse 77   Temp 97.7 F (36.5 C) (Oral)   Ht 5' 8.5 (1.74 m)   Wt 290 lb 3.2 oz (131.6 kg)   SpO2 97%   BMI 43.48 kg/m   Wt Readings from Last 3 Encounters:  05/03/24 290 lb 3.2 oz (131.6 kg)  10/30/23 293 lb 3.2 oz (133 kg)  10/23/23 299 lb 8 oz (135.9 kg)    Physical Exam Vitals and nursing note reviewed.  Constitutional:      General: He is awake. He is not in acute distress.    Appearance: He is well-developed and well-groomed. He is obese. He is not ill-appearing or toxic-appearing.  HENT:     Head: Normocephalic and atraumatic.     Right Ear: Hearing, tympanic membrane, ear canal and external ear normal. No drainage.     Left Ear: Hearing, tympanic membrane, ear canal and external ear normal. No drainage.     Nose: Nose normal.     Mouth/Throat:     Pharynx: Uvula midline.  Eyes:     General: Lids are normal.        Right eye: No discharge.        Left eye: No discharge.     Extraocular Movements: Extraocular movements intact.     Conjunctiva/sclera: Conjunctivae normal.     Pupils: Pupils are equal, round, and reactive to light.     Visual Fields: Right eye visual fields normal and left eye visual fields normal.  Neck:     Thyroid: No thyromegaly.     Vascular: No carotid bruit or JVD.     Trachea: Trachea normal.  Cardiovascular:     Rate and Rhythm: Normal rate and regular rhythm.     Heart sounds: Normal  heart sounds, S1 normal and S2 normal. No murmur heard.    No gallop.  Pulmonary:     Effort: Pulmonary effort is normal. No accessory muscle usage or respiratory distress.     Breath sounds: Normal breath sounds.  Abdominal:     General: Bowel sounds are normal.     Palpations: Abdomen is soft. There is no hepatomegaly or splenomegaly.     Tenderness: There is no abdominal tenderness.  Musculoskeletal:        General: Normal range of motion.     Cervical back: Normal range of motion and neck supple.     Right lower leg: No edema.      Left lower leg: No edema.  Lymphadenopathy:     Head:     Right side of head: No submental, submandibular, tonsillar, preauricular or posterior auricular adenopathy.     Left side of head: No submental, submandibular, tonsillar, preauricular or posterior auricular adenopathy.     Cervical: No cervical adenopathy.  Skin:    General: Skin is warm and dry.     Capillary Refill: Capillary refill takes less than 2 seconds.     Findings: No rash.  Neurological:     Mental Status: He is alert and oriented to person, place, and time.     Gait: Gait is intact.     Deep Tendon Reflexes: Reflexes are normal and symmetric.     Reflex Scores:      Brachioradialis reflexes are 2+ on the right side and 2+ on the left side.      Patellar reflexes are 2+ on the right side and 2+ on the left side. Psychiatric:        Attention and Perception: Attention normal.        Mood and Affect: Mood normal.        Speech: Speech normal.        Behavior: Behavior normal. Behavior is cooperative.        Thought Content: Thought content normal.        Cognition and Memory: Cognition normal.      Results for orders placed or performed in visit on 10/30/23  Comprehensive metabolic panel with GFR   Collection Time: 10/30/23  3:11 PM  Result Value Ref Range   Glucose 82 70 - 99 mg/dL   BUN 16 6 - 24 mg/dL   Creatinine, Ser 9.14 0.76 - 1.27 mg/dL   eGFR 894 >40 fO/fpw/8.26   BUN/Creatinine Ratio 19 9 - 20   Sodium 140 134 - 144 mmol/L   Potassium 4.2 3.5 - 5.2 mmol/L   Chloride 104 96 - 106 mmol/L   CO2 20 20 - 29 mmol/L   Calcium 9.4 8.7 - 10.2 mg/dL   Total Protein 7.2 6.0 - 8.5 g/dL   Albumin 4.5 3.8 - 4.9 g/dL   Globulin, Total 2.7 1.5 - 4.5 g/dL   Bilirubin Total 0.3 0.0 - 1.2 mg/dL   Alkaline Phosphatase 62 44 - 121 IU/L   AST 25 0 - 40 IU/L   ALT 22 0 - 44 IU/L  Lipid Panel w/o Chol/HDL Ratio   Collection Time: 10/30/23  3:11 PM  Result Value Ref Range   Cholesterol, Total 187 100 - 199 mg/dL    Triglycerides 91 0 - 149 mg/dL   HDL 36 (L) >60 mg/dL   VLDL Cholesterol Cal 17 5 - 40 mg/dL   LDL Chol Calc (NIH) 865 (H) 0 - 99 mg/dL  Assessment & Plan:   Problem List Items Addressed This Visit       Cardiovascular and Mediastinum   Essential hypertension   Chronic with some elevation on checks today and at home.  Will increase Valsartan  to 320 MG daily, educated him on this and side effects + use of medication.  Advised them if consistent lows in 90/50 range then cut Valsartan  in 1/2 and alert provider.  New sleep study referral placed last visit, has not performed.  Cannot afford at this time.  Recommend check BP 3 days a week and document levels for provider.  Focus heavily on DASH diet and regular activity.  LABS: CBC, CMP, TSH, urine ALB.  Urine ALB 23 May 2024.      Relevant Medications   valsartan  (DIOVAN ) 320 MG tablet   Other Relevant Orders   Microalbumin, Urine Waived   CBC with Differential/Platelet   TSH     Respiratory   Obstructive apnea   Ongoing with current non-use of CPAP due to issues with equipment.  Highly recommend new sleep study and use of CPAP, referral placed last visit.  Did not schedule.  Educated on risks of non-use over time to health, which we are seeing today with elevation in BP readings.          Other   Obesity - Primary   BMI 43.48.  Recommended eating smaller high protein, low fat meals more frequently and exercising 30 mins a day 5 times a week with a goal of 10-15lb weight loss in the next 3 months. Patient voiced their understanding and motivation to adhere to these recommendations.        Family history of diabetes mellitus   In mother.  Will check A1c on patient annually, today is 5.4%.      Relevant Orders   Bayer DCA Hb A1c Waived   Elevated low density lipoprotein (LDL) cholesterol level   Noted on past labs, working on diet. Check lipid panel and continue diet focus. Discussed with him that he is right on the cusp  of where medication is recommended, may need to start statin in future. The 10-year ASCVD risk score (Arnett DK, et al., 2019) is: 7.1%   Values used to calculate the score:     Age: 76 years     Clincally relevant sex: Male     Is Non-Hispanic African American: No     Diabetic: No     Tobacco smoker: No     Systolic Blood Pressure: 138 mmHg     Is BP treated: Yes     HDL Cholesterol: 36 mg/dL     Total Cholesterol: 187 mg/dL       Relevant Orders   Comprehensive metabolic panel with GFR   Lipid Panel w/o Chol/HDL Ratio   Clinical depression   Chronic, ongoing for many years.  Denies SI/HI.  At this time wishes to maintain current medication regimen, but discussed with him adding on Wellbutrin in future for mood -- which has less sex drive and weight gain side effects, but may increase motivation and mood.  Refills up to date.      Relevant Medications   sertraline  (ZOLOFT ) 100 MG tablet   Other Visit Diagnoses       Benign prostatic hyperplasia without lower urinary tract symptoms       PSA on labs today.   Relevant Orders   PSA     Need for hepatitis C screening test  Hep C screening today, educated patient.   Relevant Orders   Hepatitis C antibody     Encounter for screening for HIV       HIV screening today, educated patient.   Relevant Orders   HIV Antibody (routine testing w rflx)     Encounter for annual physical exam       Annual physical today with labs and health maintenance reviewed, discussed with patient.        Discussed aspirin prophylaxis for myocardial infarction prevention and decision was it was not indicated  LABORATORY TESTING:  Health maintenance labs ordered today as discussed above.   The natural history of prostate cancer and ongoing controversy regarding screening and potential treatment outcomes of prostate cancer has been discussed with the patient. The meaning of a false positive PSA and a false negative PSA has been discussed. He  indicates understanding of the limitations of this screening test and wishes to proceed with screening PSA testing.  IMMUNIZATIONS:   - Tdap: Tetanus vaccination status reviewed: refused. - Influenza: refused - Pneumovax: Not applicable - Prevnar: Refused - Zostavax vaccine: Refused  SCREENING: - Colonoscopy: wishes to talk to wife about it  Discussed with patient purpose of the colonoscopy is to detect colon cancer at curable precancerous or early stages   - AAA Screening: Not applicable  -Hearing Test: Not applicable  -Spirometry: Not applicable   PATIENT COUNSELING:    Sexuality: Discussed sexually transmitted diseases, partner selection, use of condoms, avoidance of unintended pregnancy  and contraceptive alternatives.   Advised to avoid cigarette smoking.  I discussed with the patient that most people either abstain from alcohol or drink within safe limits (<=14/week and <=4 drinks/occasion for males, <=7/weeks and <= 3 drinks/occasion for females) and that the risk for alcohol disorders and other health effects rises proportionally with the number of drinks per week and how often a drinker exceeds daily limits.  Discussed cessation/primary prevention of drug use and availability of treatment for abuse.   Diet: Encouraged to adjust caloric intake to maintain  or achieve ideal body weight, to reduce intake of dietary saturated fat and total fat, to limit sodium intake by avoiding high sodium foods and not adding table salt, and to maintain adequate dietary potassium and calcium preferably from fresh fruits, vegetables, and low-fat dairy products.    Stressed the importance of regular exercise  Injury prevention: Discussed safety belts, safety helmets, smoke detector, smoking near bedding or upholstery.   Dental health: Discussed importance of regular tooth brushing, flossing, and dental visits.   Follow up plan: NEXT PREVENTATIVE PHYSICAL DUE IN 1 YEAR. Return in about 4  weeks (around 05/31/2024) for HTN -- increased Valsartan  to 320 MG.

## 2024-05-04 ENCOUNTER — Ambulatory Visit: Payer: Self-pay | Admitting: Nurse Practitioner

## 2024-05-04 LAB — COMPREHENSIVE METABOLIC PANEL WITH GFR
ALT: 22 IU/L (ref 0–44)
AST: 25 IU/L (ref 0–40)
Albumin: 4.5 g/dL (ref 3.8–4.9)
Alkaline Phosphatase: 62 IU/L (ref 47–123)
BUN/Creatinine Ratio: 16 (ref 9–20)
BUN: 12 mg/dL (ref 6–24)
Bilirubin Total: 0.4 mg/dL (ref 0.0–1.2)
CO2: 22 mmol/L (ref 20–29)
Calcium: 9.4 mg/dL (ref 8.7–10.2)
Chloride: 104 mmol/L (ref 96–106)
Creatinine, Ser: 0.76 mg/dL (ref 0.76–1.27)
Globulin, Total: 2.5 g/dL (ref 1.5–4.5)
Glucose: 82 mg/dL (ref 70–99)
Potassium: 4 mmol/L (ref 3.5–5.2)
Sodium: 140 mmol/L (ref 134–144)
Total Protein: 7 g/dL (ref 6.0–8.5)
eGFR: 108 mL/min/1.73 (ref >59)

## 2024-05-04 LAB — TSH: TSH: 0.915 u[IU]/mL (ref 0.450–4.500)

## 2024-05-04 LAB — CBC WITH DIFFERENTIAL/PLATELET
Basophils Absolute: 0.1 x10E3/uL (ref 0.0–0.2)
Basos: 1 %
EOS (ABSOLUTE): 0.2 x10E3/uL (ref 0.0–0.4)
Eos: 4 %
Hematocrit: 41 % (ref 37.5–51.0)
Hemoglobin: 13.4 g/dL (ref 13.0–17.7)
Immature Grans (Abs): 0 x10E3/uL (ref 0.0–0.1)
Immature Granulocytes: 0 %
Lymphocytes Absolute: 1.5 x10E3/uL (ref 0.7–3.1)
Lymphs: 25 %
MCH: 30 pg (ref 26.6–33.0)
MCHC: 32.7 g/dL (ref 31.5–35.7)
MCV: 92 fL (ref 79–97)
Monocytes Absolute: 0.6 x10E3/uL (ref 0.1–0.9)
Monocytes: 11 %
Neutrophils Absolute: 3.4 x10E3/uL (ref 1.4–7.0)
Neutrophils: 59 %
Platelets: 214 x10E3/uL (ref 150–450)
RBC: 4.47 x10E6/uL (ref 4.14–5.80)
RDW: 13.1 % (ref 11.6–15.4)
WBC: 5.8 x10E3/uL (ref 3.4–10.8)

## 2024-05-04 LAB — LIPID PANEL W/O CHOL/HDL RATIO
Cholesterol, Total: 227 mg/dL — ABNORMAL HIGH (ref 100–199)
HDL: 52 mg/dL (ref >39)
LDL Chol Calc (NIH): 155 mg/dL — ABNORMAL HIGH (ref 0–99)
Triglycerides: 113 mg/dL (ref 0–149)
VLDL Cholesterol Cal: 20 mg/dL (ref 5–40)

## 2024-05-04 LAB — HIV ANTIBODY (ROUTINE TESTING W REFLEX): HIV Screen 4th Generation wRfx: NONREACTIVE

## 2024-05-04 LAB — PSA: Prostate Specific Ag, Serum: 1 ng/mL (ref 0.0–4.0)

## 2024-05-04 LAB — HEPATITIS C ANTIBODY: Hep C Virus Ab: NONREACTIVE

## 2024-05-25 NOTE — Patient Instructions (Signed)
 Be Involved in Caring For Your Health:  Taking Medications When medications are taken as directed, they can greatly improve your health. But if they are not taken as prescribed, they may not work. In some cases, not taking them correctly can be harmful. To help ensure your treatment remains effective and safe, understand your medications and how to take them. Bring your medications to each visit for review by your provider.  Your lab results, notes, and after visit summary will be available on My Chart. We strongly encourage you to use this feature. If lab results are abnormal the clinic will contact you with the appropriate steps. If the clinic does not contact you assume the results are satisfactory. You can always view your results on My Chart. If you have questions regarding your health or results, please contact the clinic during office hours. You can also ask questions on My Chart.  We at [facility] are grateful that you chose us  to provide your care. We strive to provide evidence-based and compassionate care and are always looking for feedback. If you get a survey from the clinic please complete this so we can hear your opinions.  DASH Eating Plan DASH stands for Dietary Approaches to Stop Hypertension. The DASH eating plan is a healthy eating plan that has been shown to: Lower high blood pressure (hypertension). Reduce your risk for type 2 diabetes, heart disease, and stroke. Help with weight loss. What are tips for following this plan? Reading food labels Check food labels for the amount of salt (sodium) per serving. Choose foods with less than 5 percent of the Daily Value (DV) of sodium. In general, foods with less than 300 milligrams (mg) of sodium per serving fit into this eating plan. To find whole grains, look for the word whole as the first word in the ingredient list. Shopping Buy products labeled as low-sodium or no salt added. Buy fresh foods. Avoid canned  foods and pre-made or frozen meals. Cooking Try not to add salt when you cook. Use salt-free seasonings or herbs instead of table salt or sea salt. Check with your health care provider or pharmacist before using salt substitutes. Do not fry foods. Cook foods in healthy ways, such as baking, boiling, grilling, roasting, or broiling. Cook using oils that are good for your heart. These include olive, canola, avocado, soybean, and sunflower oil. Meal planning  Eat a balanced diet. This should include: 4 or more servings of fruits and 4 or more servings of vegetables each day. Try to fill half of your plate with fruits and vegetables. 6-8 servings of whole grains each day. 6 or less servings of lean meat, poultry, or fish each day. 1 oz is 1 serving. A 3 oz (85 g) serving of meat is about the same size as the palm of your hand. One egg is 1 oz (28 g). 2-3 servings of low-fat dairy each day. One serving is 1 cup (237 mL). 1 serving of nuts, seeds, or beans 5 times each week. 2-3 servings of heart-healthy fats. Healthy fats called omega-3 fatty acids are found in foods such as walnuts, flaxseeds, fortified milks, and eggs. These fats are also found in cold-water fish, such as sardines, salmon, and mackerel. Limit how much you eat of: Canned or prepackaged foods. Food that is high in trans fat, such as fried foods. Food that is high in saturated fat, such as fatty meat. Desserts and other sweets, sugary drinks, and other foods with added sugar. Full-fat  dairy products. Do not salt foods before eating. Do not eat more than 4 egg yolks a week. Try to eat at least 2 vegetarian meals a week. Eat more home-cooked food and less restaurant, buffet, and fast food. Lifestyle When eating at a restaurant, ask if your food can be made with less salt or no salt. If you drink alcohol: Limit how much you have to: 0-1 drink a day if you are male. 0-2 drinks a day if you are male. Know how much alcohol is in  your drink. In the [place] one drink is one 12 oz bottle of beer (355 mL), one 5 oz glass of wine (148 mL), or one 1 oz glass of hard liquor (44 mL). General information Avoid eating more than 2,300 mg of salt a day. If you have hypertension, you may need to reduce your sodium intake to 1,500 mg a day. Work with your provider to stay at a healthy body weight or lose weight. Ask what the best weight range is for you. On most days of the week, get at least 30 minutes of exercise that causes your heart to beat faster. This may include walking, swimming, or biking. Work with your provider or dietitian to adjust your eating plan to meet your specific calorie needs. What foods should I eat? Fruits All fresh, dried, or frozen fruit. Canned fruits that are in their natural juice and do not have sugar added to them. Vegetables Fresh or frozen vegetables that are raw, steamed, roasted, or grilled. Low-sodium or reduced-sodium tomato and vegetable juice. Low-sodium or reduced-sodium tomato sauce and tomato paste. Low-sodium or reduced-sodium canned vegetables. Grains Whole-grain or whole-wheat bread. Whole-grain or whole-wheat pasta. Brown rice. [place] Madeira. Bulgur. Whole-grain and low-sodium cereals. Pita bread. Low-fat, low-sodium crackers. Whole-wheat flour tortillas. Meats and other proteins Skinless chicken or [place]. Ground chicken or [place]. Pork with fat trimmed off. Fish and seafood. Egg whites. Dried beans, peas, or lentils. Unsalted nuts, nut butters, and seeds. Unsalted canned beans. Lean cuts of beef with fat trimmed off. Low-sodium, lean precooked or cured meat, such as sausages or meat loaves. Dairy Low-fat (1%) or fat-free (skim) milk. Reduced-fat, low-fat, or fat-free cheeses. Nonfat, low-sodium ricotta or cottage cheese. Low-fat or nonfat yogurt. Low-fat, low-sodium cheese. Fats and oils Soft margarine without trans fats. Vegetable oil. Reduced-fat, low-fat, or light mayonnaise and salad  dressings (reduced-sodium). Canola, safflower, olive, avocado, soybean, and sunflower oils. Avocado. Seasonings and condiments Herbs. Spices. Seasoning mixes without salt. Other foods Unsalted popcorn and pretzels. Fat-free sweets. The items listed above may not be all the foods and drinks you can have. Talk to a dietitian to learn more. What foods should I avoid? Fruits Canned fruit in a light or heavy syrup. Fried fruit. Fruit in cream or butter sauce. Vegetables Creamed or fried vegetables. Vegetables in a cheese sauce. Regular canned vegetables that are not marked as low-sodium or reduced-sodium. Regular canned tomato sauce and paste that are not marked as low-sodium or reduced-sodium. Regular tomato and vegetable juices that are not marked as low-sodium or reduced-sodium. Dene. Olives. Grains Baked goods made with fat, such as croissants, muffins, or some breads. Dry pasta or rice meal packs. Meats and other proteins Fatty cuts of meat. Ribs. Fried meat. [place]. Bologna, salami, and other precooked or cured meats, such as sausages or meat loaves, that are not lean and low in sodium. Fat from the back of a pig (fatback). Bratwurst. Salted nuts and seeds. Canned beans with added salt. Canned  or smoked fish. Whole eggs or egg yolks. Chicken or [place] with skin. Dairy Whole or 2% milk, cream, and half-and-half. Whole or full-fat cream cheese. Whole-fat or sweetened yogurt. Full-fat cheese. Nondairy creamers. Whipped toppings. Processed cheese and cheese spreads. Fats and oils Butter. Stick margarine. Lard. Shortening. Ghee. Bacon fat. Tropical oils, such as coconut, palm kernel, or palm oil. Seasonings and condiments Onion salt, garlic salt, seasoned salt, table salt, and sea salt. Worcestershire sauce. Tartar sauce. Barbecue sauce. Teriyaki sauce. Soy sauce, including reduced-sodium soy sauce. Steak sauce. Canned and packaged gravies. Fish sauce. Oyster sauce. Cocktail sauce. Store-bought  horseradish. Ketchup. Mustard. Meat flavorings and tenderizers. Bouillon cubes. Hot sauces. Pre-made or packaged marinades. Pre-made or packaged taco seasonings. Relishes. Regular salad dressings. Other foods Salted popcorn and pretzels. The items listed above may not be all the foods and drinks you should avoid. Talk to a dietitian to learn more. Where to find more information National Heart, Lung, and Blood Institute (NHLBI): BuffaloDryCleaner.gl American Heart Association (AHA): heart.org Academy of Nutrition and Dietetics: eatright.org National Kidney Foundation (NKF): kidney.org This information is not intended to replace advice given to you by your health care provider. Make sure you discuss any questions you have with your health care provider. Document Revised: 07/28/2022 Document Reviewed: 07/28/2022 Elsevier Patient Education  2024 [employer].

## 2024-05-31 ENCOUNTER — Ambulatory Visit: Admitting: Nurse Practitioner

## 2024-05-31 ENCOUNTER — Encounter: Payer: Self-pay | Admitting: Nurse Practitioner

## 2024-05-31 VITALS — BP 115/73 | HR 86 | Temp 97.8°F | Ht 68.7 in | Wt 290.0 lb

## 2024-05-31 DIAGNOSIS — I10 Essential (primary) hypertension: Secondary | ICD-10-CM | POA: Diagnosis not present

## 2024-05-31 MED ORDER — SERTRALINE HCL 100 MG PO TABS: 200.0000 mg | ORAL_TABLET | Freq: Every day | ORAL | 3 refills | Status: AC

## 2024-05-31 NOTE — Assessment & Plan Note (Signed)
 Chronic, BP well at goal with changes.  Will continue Valsartan  320 MG daily, educated him on this and side effects + use of medication.  Advised them if consistent lows in 90/50 range then cut Valsartan  in 1/2 and alert provider.  New sleep study referral placed last visit, has not performed.  Cannot afford at this time.  Recommend check BP 3 days a week and document levels for provider.  Focus heavily on DASH diet and regular activity.  LABS: BMP.  Urine ALB 23 May 2024.

## 2024-05-31 NOTE — Progress Notes (Signed)
 BP 115/73   Pulse 86   Temp 97.8 F (36.6 C) (Oral)   Ht 5' 8.7 (1.745 m)   Wt 290 lb (131.5 kg)   SpO2 96%   BMI 43.20 kg/m    Subjective:    Patient ID: Roger Ray, male    DOB: 01/12/1971, 53 y.o.   MRN: [id number]  HPI: Roger Ray is a 53 y.o. male  Chief Complaint  Patient presents with   Hypertension   HYPERTENSION without Chronic Kidney Disease Follow-up today for increase in Valsartan  4 weeks ago to 320 MG daily. Tolerating this well. Hypertension status: stable  Satisfied with current treatment? yes Duration of hypertension: chronic BP monitoring frequency:  a few times a week BP range: <130/80 BP medication side effects:  no Medication compliance: good compliance Aspirin: no Recurrent headaches: no Visual changes: no Palpitations: no Dyspnea: no Chest pain: no Lower extremity edema: no Dizzy/lightheaded: no   Relevant past medical, surgical, family and social history reviewed and updated as indicated. Interim medical history since our last visit reviewed. Allergies and medications reviewed and updated.  Review of Systems  Constitutional:  Negative for activity change, diaphoresis, fatigue and fever.  Respiratory:  Negative for cough, chest tightness, shortness of breath and wheezing.   Cardiovascular:  Negative for chest pain, palpitations and leg swelling.  Gastrointestinal: Negative.   Neurological: Negative.   Psychiatric/Behavioral: Negative.      Per HPI unless specifically indicated above     Objective:    BP 115/73   Pulse 86   Temp 97.8 F (36.6 C) (Oral)   Ht 5' 8.7 (1.745 m)   Wt 290 lb (131.5 kg)   SpO2 96%   BMI 43.20 kg/m   Wt Readings from Last 3 Encounters:  05/31/24 290 lb (131.5 kg)  05/03/24 290 lb 3.2 oz (131.6 kg)  10/30/23 293 lb 3.2 oz (133 kg)    Physical Exam Vitals and nursing note reviewed.  Constitutional:      General: He is awake. He is not in acute distress.    Appearance: He is  well-developed and well-groomed. He is obese. He is not ill-appearing or toxic-appearing.  HENT:     Head: Normocephalic.     Right Ear: Hearing and external ear normal.     Left Ear: Hearing and external ear normal.  Eyes:     General: Lids are normal.     Extraocular Movements: Extraocular movements intact.     Conjunctiva/sclera: Conjunctivae normal.  Neck:     Thyroid: No thyromegaly.     Vascular: No carotid bruit.  Cardiovascular:     Rate and Rhythm: Normal rate and regular rhythm.     Heart sounds: Normal heart sounds. No murmur heard.    No gallop.  Pulmonary:     Effort: No accessory muscle usage or respiratory distress.     Breath sounds: Normal breath sounds.  Abdominal:     General: Bowel sounds are normal. There is no distension.     Palpations: Abdomen is soft.     Tenderness: There is no abdominal tenderness.  Musculoskeletal:     Cervical back: Full passive range of motion without pain.     Right lower leg: No edema.     Left lower leg: No edema.  Lymphadenopathy:     Cervical: No cervical adenopathy.  Skin:    General: Skin is warm.     Capillary Refill: Capillary refill takes less than 2 seconds.  Neurological:  Mental Status: He is alert and oriented to person, place, and time.     Deep Tendon Reflexes: Reflexes are normal and symmetric.     Reflex Scores:      Brachioradialis reflexes are 2+ on the right side and 2+ on the left side.      Patellar reflexes are 2+ on the right side and 2+ on the left side. Psychiatric:        Attention and Perception: Attention normal.        Mood and Affect: Mood normal.        Speech: Speech normal.        Behavior: Behavior normal. Behavior is cooperative.        Thought Content: Thought content normal.     Results for orders placed or performed in visit on 05/03/24  Microalbumin, Urine Waived   Collection Time: 05/03/24  2:06 PM  Result Value Ref Range   Microalb, Ur Waived 30 (H) 0 - 19 mg/L    Creatinine, Urine Waived 300 10 - 300 mg/dL   Microalb/Creat Ratio <30 <30 mg/g  Bayer DCA Hb A1c Waived   Collection Time: 05/03/24  2:06 PM  Result Value Ref Range   HB A1C (BAYER DCA - WAIVED) 5.4 4.8 - 5.6 %  CBC with Differential/Platelet   Collection Time: 05/03/24  2:09 PM  Result Value Ref Range   WBC 5.8 3.4 - 10.8 x10E3/uL   RBC 4.47 4.14 - 5.80 x10E6/uL   Hemoglobin 13.4 13.0 - 17.7 g/dL   Hematocrit 58.9 62.4 - 51.0 %   MCV 92 79 - 97 fL   MCH 30.0 26.6 - 33.0 pg   MCHC 32.7 31.5 - 35.7 g/dL   RDW 86.8 88.3 - 84.5 %   Platelets 214 150 - 450 x10E3/uL   Neutrophils 59 Not Estab. %   Lymphs 25 Not Estab. %   Monocytes 11 Not Estab. %   Eos 4 Not Estab. %   Basos 1 Not Estab. %   Neutrophils Absolute 3.4 1.4 - 7.0 x10E3/uL   Lymphocytes Absolute 1.5 0.7 - 3.1 x10E3/uL   Monocytes Absolute 0.6 0.1 - 0.9 x10E3/uL   EOS (ABSOLUTE) 0.2 0.0 - 0.4 x10E3/uL   Basophils Absolute 0.1 0.0 - 0.2 x10E3/uL   Immature Granulocytes 0 Not Estab. %   Immature Grans (Abs) 0.0 0.0 - 0.1 x10E3/uL  Comprehensive metabolic panel with GFR   Collection Time: 05/03/24  2:09 PM  Result Value Ref Range   Glucose 82 70 - 99 mg/dL   BUN 12 6 - 24 mg/dL   Creatinine, Ser 9.23 0.76 - 1.27 mg/dL   eGFR 891 >40 fO/fpw/8.26   BUN/Creatinine Ratio 16 9 - 20   Sodium 140 134 - 144 mmol/L   Potassium 4.0 3.5 - 5.2 mmol/L   Chloride 104 96 - 106 mmol/L   CO2 22 20 - 29 mmol/L   Calcium 9.4 8.7 - 10.2 mg/dL   Total Protein 7.0 6.0 - 8.5 g/dL   Albumin 4.5 3.8 - 4.9 g/dL   Globulin, Total 2.5 1.5 - 4.5 g/dL   Bilirubin Total 0.4 0.0 - 1.2 mg/dL   Alkaline Phosphatase 62 47 - 123 IU/L   AST 25 0 - 40 IU/L   ALT 22 0 - 44 IU/L  TSH   Collection Time: 05/03/24  2:09 PM  Result Value Ref Range   TSH 0.915 0.450 - 4.500 uIU/mL  PSA   Collection Time: 05/03/24  2:09 PM  Result  Value Ref Range   Prostate Specific Ag, Serum 1.0 0.0 - 4.0 ng/mL  Lipid Panel w/o Chol/HDL Ratio   Collection Time:  05/03/24  2:09 PM  Result Value Ref Range   Cholesterol, Total 227 (H) 100 - 199 mg/dL   Triglycerides 886 0 - 149 mg/dL   HDL 52 >60 mg/dL   VLDL Cholesterol Cal 20 5 - 40 mg/dL   LDL Chol Calc (NIH) 844 (H) 0 - 99 mg/dL  Hepatitis C antibody   Collection Time: 05/03/24  2:09 PM  Result Value Ref Range   Hep C Virus Ab Non Reactive Non Reactive  HIV Antibody (routine testing w rflx)   Collection Time: 05/03/24  2:09 PM  Result Value Ref Range   HIV Screen 4th Generation wRfx Non Reactive Non Reactive      Assessment & Plan:   Problem List Items Addressed This Visit       Cardiovascular and Mediastinum   Essential hypertension - Primary   Chronic, BP well at goal with changes.  Will continue Valsartan  320 MG daily, educated him on this and side effects + use of medication.  Advised them if consistent lows in 90/50 range then cut Valsartan  in 1/2 and alert provider.  New sleep study referral placed last visit, has not performed.  Cannot afford at this time.  Recommend check BP 3 days a week and document levels for provider.  Focus heavily on DASH diet and regular activity.  LABS: BMP.  Urine ALB 23 May 2024.      Relevant Orders   Basic metabolic panel with GFR     Follow up plan: Return in about 6 months (around 11/28/2024) for HTN/HLD, Depression.

## 2024-06-01 ENCOUNTER — Ambulatory Visit: Payer: Self-pay | Admitting: Nurse Practitioner

## 2024-06-01 LAB — BASIC METABOLIC PANEL WITH GFR
BUN/Creatinine Ratio: 17 (ref 9–20)
BUN: 14 mg/dL (ref 6–24)
CO2: 22 mmol/L (ref 20–29)
Calcium: 10.2 mg/dL (ref 8.7–10.2)
Chloride: 102 mmol/L (ref 96–106)
Creatinine, Ser: 0.83 mg/dL (ref 0.76–1.27)
Glucose: 85 mg/dL (ref 70–99)
Potassium: 4.5 mmol/L (ref 3.5–5.2)
Sodium: 143 mmol/L (ref 134–144)
eGFR: 105 mL/min/1.73 (ref >59)

## 2024-06-01 NOTE — Progress Notes (Signed)
 Contacted via MyChart  Good morning In[place], your kidney function and potassium remain normal. Continue all medications. Any questions? Keep being amazing!!  Thank you for allowing me to participate in your care.  I appreciate you. Kindest regards, Lucero Ide

## 2024-06-11 ENCOUNTER — Telehealth: Payer: Self-pay

## 2024-06-11 DIAGNOSIS — Z7189 Other specified counseling: Secondary | ICD-10-CM

## 2024-06-11 NOTE — Progress Notes (Signed)
 Complex Care Management Note  Care Guide Note 06/11/2024 Name: Theador Jezewski MRN: [id number] DOB: 12-30-1970  Darean Rote is a 53 y.o. year old male who sees Cannady, Melanie T, NP for primary care. I reached out to Oneil Boxer by phone today to offer complex care management services.  Mr. Stettler was given information about Complex Care Management services today including:   The Complex Care Management services include support from the care team which includes your Nurse Care Manager, Clinical Social Worker, or Pharmacist.  The Complex Care Management team is here to help remove barriers to the health concerns and goals most important to you. Complex Care Management services are voluntary, and the patient may decline or stop services at any time by request to their care team member.   Complex Care Management Consent Status: Patient agreed to services and verbal consent obtained.   Follow up plan:  Telephone appointment with complex care management team member scheduled for:  06/27/2024  Encounter Outcome:  Patient Scheduled  Jeoffrey Buffalo , RMA     [place]  [facility], [facility] Guide  Direct Dial: [phone]  Website: [email or web address]

## 2024-06-27 ENCOUNTER — Telehealth: Payer: Self-pay

## 2024-06-27 NOTE — Patient Instructions (Signed)
 Oneil Boxer - I am sorry I was unable to reach you today for our scheduled appointment. I work with Valerio Melanie DASEN, NP and am calling to support your healthcare needs. Please contact me at [phone] at your earliest convenience. I look forward to speaking with you soon.   Thank you,  Hendricks Her RN, BSN  [facility] RN Case Manager   Direct [phone]

## 2024-07-04 ENCOUNTER — Telehealth: Payer: Self-pay

## 2024-07-04 NOTE — Progress Notes (Signed)
 Complex Care Management Care Guide Note  07/04/2024 Name: Roger Ray MRN: [id number] DOB: 20-Jul-1971  Roger Ray is a 53 y.o. year old male who is a primary care patient of Cannady, Jolene T, NP and is actively engaged with the care management team. I reached out to Roger Ray by phone today to assist with re-scheduling  with the RN Case Manager.  Follow up plan: Unsuccessful telephone outreach attempt made. A HIPAA compliant phone message was left for the patient providing contact information and requesting a return call.  Roger Ray , RMA     [facility]  [facility] Guide  Direct Dial: [phone]  Website: [email or web address]

## 2024-07-22 NOTE — Progress Notes (Signed)
 Complex Care Management Care Guide Note  07/22/2024 Name: Roger Ray MRN: [id number] DOB: 07-05-1971  Roger Ray is a 53 y.o. year old male who is a primary care patient of Cannady, Jolene T, NP and is actively engaged with the care management team. I reached out to Oneil Boxer by phone today to assist with re-scheduling  with the RN Case Manager.  Follow up plan: Unsuccessful telephone outreach attempt made. A HIPAA compliant phone message was left for the patient providing contact information and requesting a return call.  Jeoffrey Buffalo , RMA     [facility]  [facility], [facility] Guide  Direct Dial: [phone]  Website: [email or web address]

## 2024-11-29 ENCOUNTER — Ambulatory Visit: Admitting: Nurse Practitioner
